# Patient Record
Sex: Female | Born: 1955 | Race: Black or African American | Hispanic: No | Smoking: Never smoker
Health system: Southern US, Community
[De-identification: ages and names within clinical notes are randomized; demographics above are authoritative.]

## PROBLEM LIST (undated history)

## (undated) DIAGNOSIS — I10 Essential (primary) hypertension: Secondary | ICD-10-CM

## (undated) DIAGNOSIS — E119 Type 2 diabetes mellitus without complications: Secondary | ICD-10-CM

---

## 2022-02-26 ENCOUNTER — Inpatient Hospital Stay
Admission: EM | Admit: 2022-02-26 | Discharge: 2022-03-01 | DRG: 637 | Disposition: A | Discharge status: Home / Self Care | Payer: No Typology Code available for payment source | Attending: Student | Admitting: Student

## 2022-02-26 ENCOUNTER — Emergency Department: Payer: No Typology Code available for payment source

## 2022-02-26 ENCOUNTER — Other Ambulatory Visit: Payer: Self-pay

## 2022-02-26 ENCOUNTER — Ambulatory Visit
Admission: EM | Admit: 2022-02-26 | Discharge: 2022-02-26 | Disposition: A | Discharge status: Home / Self Care | Payer: Medicare PPO | Attending: Student | Admitting: Student

## 2022-02-26 DIAGNOSIS — Z79899 Other long term (current) drug therapy: Secondary | ICD-10-CM | POA: Diagnosis not present

## 2022-02-26 DIAGNOSIS — N170 Acute kidney failure with tubular necrosis: Secondary | ICD-10-CM | POA: Diagnosis present

## 2022-02-26 DIAGNOSIS — I9589 Other hypotension: Secondary | ICD-10-CM | POA: Diagnosis not present

## 2022-02-26 DIAGNOSIS — Z880 Allergy status to penicillin: Secondary | ICD-10-CM

## 2022-02-26 DIAGNOSIS — E1110 Type 2 diabetes mellitus with ketoacidosis without coma: Secondary | ICD-10-CM | POA: Diagnosis not present

## 2022-02-26 DIAGNOSIS — Z794 Long term (current) use of insulin: Secondary | ICD-10-CM

## 2022-02-26 DIAGNOSIS — I959 Hypotension, unspecified: Secondary | ICD-10-CM | POA: Diagnosis present

## 2022-02-26 DIAGNOSIS — I10 Essential (primary) hypertension: Secondary | ICD-10-CM | POA: Diagnosis present

## 2022-02-26 DIAGNOSIS — E875 Hyperkalemia: Secondary | ICD-10-CM | POA: Diagnosis not present

## 2022-02-26 DIAGNOSIS — E861 Hypovolemia: Secondary | ICD-10-CM | POA: Diagnosis not present

## 2022-02-26 DIAGNOSIS — E870 Hyperosmolality and hypernatremia: Secondary | ICD-10-CM | POA: Diagnosis present

## 2022-02-26 DIAGNOSIS — N179 Acute kidney failure, unspecified: Secondary | ICD-10-CM | POA: Diagnosis not present

## 2022-02-26 DIAGNOSIS — E785 Hyperlipidemia, unspecified: Secondary | ICD-10-CM | POA: Diagnosis present

## 2022-02-26 HISTORY — DX: Essential (primary) hypertension: I10

## 2022-02-26 HISTORY — DX: Type 2 diabetes mellitus without complications: E11.9

## 2022-02-26 LAB — BASIC METABOLIC PANEL
Anion gap: 24 — ABNORMAL HIGH (ref 5–15)
Anion gap: 14 (ref 5–15)
Anion gap: 12 (ref 5–15)
BUN: 59 mg/dL — ABNORMAL HIGH (ref 8–23)
BUN: 58 mg/dL — ABNORMAL HIGH (ref 8–23)
BUN: 54 mg/dL — ABNORMAL HIGH (ref 8–23)
CO2: 13 mmol/L — ABNORMAL LOW (ref 22–32)
CO2: 19 mmol/L — ABNORMAL LOW (ref 22–32)
CO2: 19 mmol/L — ABNORMAL LOW (ref 22–32)
Calcium: 9.8 mg/dL (ref 8.9–10.3)
Calcium: 9.8 mg/dL (ref 8.9–10.3)
Calcium: 9.8 mg/dL (ref 8.9–10.3)
Chloride: 116 mmol/L — ABNORMAL HIGH (ref 98–111)
Chloride: 126 mmol/L — ABNORMAL HIGH (ref 98–111)
Chloride: 128 mmol/L — ABNORMAL HIGH (ref 98–111)
Creatinine, Ser: 2.1 mg/dL — ABNORMAL HIGH (ref 0.44–1.00)
Creatinine, Ser: 1.76 mg/dL — ABNORMAL HIGH (ref 0.44–1.00)
Creatinine, Ser: 1.52 mg/dL — ABNORMAL HIGH (ref 0.44–1.00)
GFR, Estimated: 25 mL/min — ABNORMAL LOW (ref >60)
GFR, Estimated: 31 mL/min — ABNORMAL LOW (ref >60)
GFR, Estimated: 37 mL/min — ABNORMAL LOW (ref >60)
Glucose, Bld: 649 mg/dL (ref 70–99)
Glucose, Bld: 333 mg/dL — ABNORMAL HIGH (ref 70–99)
Glucose, Bld: 209 mg/dL — ABNORMAL HIGH (ref 70–99)
Potassium: 5.3 mmol/L — ABNORMAL HIGH (ref 3.5–5.1)
Potassium: 4.6 mmol/L (ref 3.5–5.1)
Potassium: 4.9 mmol/L (ref 3.5–5.1)
Sodium: 153 mmol/L — ABNORMAL HIGH (ref 135–145)
Sodium: 159 mmol/L — ABNORMAL HIGH (ref 135–145)
Sodium: 159 mmol/L — ABNORMAL HIGH (ref 135–145)

## 2022-02-26 LAB — CBC WITH DIFFERENTIAL/PLATELET
Abs Immature Granulocytes: 0.09 10*3/uL — ABNORMAL HIGH (ref 0.00–0.07)
Basophils Absolute: 0 10*3/uL (ref 0.0–0.1)
Basophils Relative: 0 %
Eosinophils Absolute: 0 10*3/uL (ref 0.0–0.5)
Eosinophils Relative: 0 %
HCT: 46.4 % — ABNORMAL HIGH (ref 36.0–46.0)
Hemoglobin: 13.6 g/dL (ref 12.0–15.0)
Immature Granulocytes: 1 %
Lymphocytes Relative: 9 %
Lymphs Abs: 0.8 10*3/uL (ref 0.7–4.0)
MCH: 27.9 pg (ref 26.0–34.0)
MCHC: 29.3 g/dL — ABNORMAL LOW (ref 30.0–36.0)
MCV: 95.1 fL (ref 80.0–100.0)
Monocytes Absolute: 0.4 10*3/uL (ref 0.1–1.0)
Monocytes Relative: 5 %
Neutro Abs: 6.7 10*3/uL (ref 1.7–7.7)
Neutrophils Relative %: 85 %
Platelets: 199 10*3/uL (ref 150–400)
RBC: 4.88 MIL/uL (ref 3.87–5.11)
RDW: 13.3 % (ref 11.5–15.5)
WBC: 8 10*3/uL (ref 4.0–10.5)
nRBC: 0 % (ref 0.0–0.2)

## 2022-02-26 LAB — URINALYSIS, COMPLETE (UACMP) WITH MICROSCOPIC
Bacteria, UA: NONE SEEN
Bilirubin Urine: NEGATIVE
Glucose, UA: >=500 mg/dL — AB
Ketones, ur: 80 mg/dL — AB
Leukocytes,Ua: NEGATIVE
Nitrite: NEGATIVE
Protein, ur: 30 mg/dL — AB
Specific Gravity, Urine: 1.02 (ref 1.005–1.030)
pH: 5 (ref 5.0–8.0)

## 2022-02-26 LAB — TROPONIN I (HIGH SENSITIVITY)
Troponin I (High Sensitivity): 10 ng/L (ref <18)
Troponin I (High Sensitivity): 8 ng/L (ref <18)

## 2022-02-26 LAB — OSMOLALITY: Osmolality: 396 mOsm/kg (ref 275–295)

## 2022-02-26 LAB — COMPREHENSIVE METABOLIC PANEL
ALT: 17 U/L (ref 0–44)
AST: 13 U/L — ABNORMAL LOW (ref 15–41)
Albumin: 3.9 g/dL (ref 3.5–5.0)
Alkaline Phosphatase: 134 U/L — ABNORMAL HIGH (ref 38–126)
Anion gap: 25 — ABNORMAL HIGH (ref 5–15)
BUN: 62 mg/dL — ABNORMAL HIGH (ref 8–23)
CO2: 11 mmol/L — ABNORMAL LOW (ref 22–32)
Calcium: 9.1 mg/dL (ref 8.9–10.3)
Chloride: 113 mmol/L — ABNORMAL HIGH (ref 98–111)
Creatinine, Ser: 2.42 mg/dL — ABNORMAL HIGH (ref 0.44–1.00)
GFR, Estimated: 21 mL/min — ABNORMAL LOW (ref >60)
Glucose, Bld: 886 mg/dL (ref 70–99)
Potassium: 7 mmol/L (ref 3.5–5.1)
Sodium: 149 mmol/L — ABNORMAL HIGH (ref 135–145)
Total Bilirubin: 1.3 mg/dL — ABNORMAL HIGH (ref 0.3–1.2)
Total Protein: 7.5 g/dL (ref 6.5–8.1)

## 2022-02-26 LAB — BLOOD GAS, VENOUS
Acid-base deficit: 18.9 mmol/L — ABNORMAL HIGH (ref 0.0–2.0)
Bicarbonate: 11.1 mmol/L — ABNORMAL LOW (ref 20.0–28.0)
O2 Saturation: 46.1 %
Patient temperature: 37
pCO2, Ven: 40 mmHg — ABNORMAL LOW (ref 44–60)
pH, Ven: 7.05 — CL (ref 7.25–7.43)
pO2, Ven: 31 mmHg — CL (ref 32–45)

## 2022-02-26 LAB — GLUCOSE, CAPILLARY
Glucose-Capillary: >600 mg/dL (ref 70–99)
Glucose-Capillary: 437 mg/dL — ABNORMAL HIGH (ref 70–99)
Glucose-Capillary: 375 mg/dL — ABNORMAL HIGH (ref 70–99)
Glucose-Capillary: 262 mg/dL — ABNORMAL HIGH (ref 70–99)
Glucose-Capillary: 149 mg/dL — ABNORMAL HIGH (ref 70–99)
Glucose-Capillary: 156 mg/dL — ABNORMAL HIGH (ref 70–99)
Glucose-Capillary: 152 mg/dL — ABNORMAL HIGH (ref 70–99)

## 2022-02-26 LAB — CBG MONITORING, ED
Glucose-Capillary: >600 mg/dL (ref 70–99)
Glucose-Capillary: >600 mg/dL (ref 70–99)
Glucose-Capillary: >600 mg/dL (ref 70–99)
Glucose-Capillary: 526 mg/dL (ref 70–99)

## 2022-02-26 LAB — HEMOGLOBIN A1C
Hgb A1c MFr Bld: 12.5 % — ABNORMAL HIGH (ref 4.8–5.6)
Mean Plasma Glucose: 312.05 mg/dL

## 2022-02-26 LAB — BETA-HYDROXYBUTYRIC ACID
Beta-Hydroxybutyric Acid: >8 mmol/L — ABNORMAL HIGH (ref 0.05–0.27)
Beta-Hydroxybutyric Acid: 2.9 mmol/L — ABNORMAL HIGH (ref 0.05–0.27)

## 2022-02-26 LAB — LACTIC ACID, PLASMA
Lactic Acid, Venous: 4.7 mmol/L (ref 0.5–1.9)
Lactic Acid, Venous: 3.8 mmol/L (ref 0.5–1.9)

## 2022-02-26 LAB — HIV ANTIBODY (ROUTINE TESTING W REFLEX): HIV Screen 4th Generation wRfx: NONREACTIVE

## 2022-02-26 LAB — MRSA NEXT GEN BY PCR, NASAL: MRSA by PCR Next Gen: NOT DETECTED

## 2022-02-26 LAB — PROCALCITONIN: Procalcitonin: 0.12 ng/mL

## 2022-02-26 MED ORDER — INSULIN REGULAR(HUMAN) IN NACL 100-0.9 UT/100ML-% IV SOLN
INTRAVENOUS | Status: DC
  Filled 2022-02-26: qty 100

## 2022-02-26 MED ORDER — CALCIUM GLUCONATE-NACL 1-0.675 GM/50ML-% IV SOLN
1.0000 g | Freq: Once | INTRAVENOUS | Status: AC
  Administered 2022-02-26: 1000 mg via INTRAVENOUS
  Filled 2022-02-26: qty 50

## 2022-02-26 MED ORDER — ROSUVASTATIN CALCIUM 10 MG PO TABS
10.0000 mg | ORAL_TABLET | Freq: Every day | ORAL | Status: DC
  Administered 2022-02-27 – 2022-03-01 (×3): 10 mg via ORAL
  Filled 2022-02-26 (×3): qty 1

## 2022-02-26 MED ORDER — ENOXAPARIN SODIUM 30 MG/0.3ML IJ SOSY
30.0000 mg | PREFILLED_SYRINGE | INTRAMUSCULAR | Status: DC
Start: 2022-02-26 — End: 2022-02-27
  Administered 2022-02-26: 30 mg via SUBCUTANEOUS
  Filled 2022-02-26: qty 0.3

## 2022-02-26 MED ORDER — INSULIN REGULAR(HUMAN) IN NACL 100-0.9 UT/100ML-% IV SOLN
INTRAVENOUS | Status: DC
  Administered 2022-02-26: 9 [IU]/h via INTRAVENOUS

## 2022-02-26 MED ORDER — VANCOMYCIN HCL IN DEXTROSE 1-5 GM/200ML-% IV SOLN
1000.0000 mg | Freq: Once | INTRAVENOUS | Status: DC
  Filled 2022-02-26: qty 200

## 2022-02-26 MED ORDER — LACTATED RINGERS IV BOLUS
20.0000 mL/kg | Freq: Once | INTRAVENOUS | Status: AC
  Administered 2022-02-26: 1400 mL via INTRAVENOUS

## 2022-02-26 MED ORDER — LACTATED RINGERS IV BOLUS (SEPSIS)
1000.0000 mL | Freq: Once | INTRAVENOUS | Status: AC
  Administered 2022-02-26: 1000 mL via INTRAVENOUS

## 2022-02-26 MED ORDER — SODIUM CHLORIDE 0.9 % IV SOLN
2.0000 g | Freq: Once | INTRAVENOUS | Status: AC
  Administered 2022-02-26: 2 g via INTRAVENOUS
  Filled 2022-02-26: qty 12.5

## 2022-02-26 MED ORDER — LACTATED RINGERS IV SOLN: INTRAVENOUS | Status: DC

## 2022-02-26 MED ORDER — ALBUTEROL SULFATE (2.5 MG/3ML) 0.083% IN NEBU
2.5000 mg | INHALATION_SOLUTION | Freq: Once | RESPIRATORY_TRACT | Status: AC
  Administered 2022-02-26: 2.5 mg via RESPIRATORY_TRACT
  Filled 2022-02-26: qty 3

## 2022-02-26 MED ORDER — DEXTROSE 50 % IV SOLN: 0.0000 mL | INTRAVENOUS | Status: DC | PRN

## 2022-02-26 MED ORDER — LACTATED RINGERS IV BOLUS (SEPSIS)
250.0000 mL | Freq: Once | INTRAVENOUS | Status: AC
Start: 2022-02-26 — End: 2022-02-26
  Administered 2022-02-26: 250 mL via INTRAVENOUS

## 2022-02-26 MED ORDER — VANCOMYCIN HCL 1500 MG/300ML IV SOLN
1500.0000 mg | Freq: Once | INTRAVENOUS | Status: AC
  Administered 2022-02-26: 1500 mg via INTRAVENOUS
  Filled 2022-02-26: qty 300

## 2022-02-26 MED ORDER — LACTATED RINGERS IV BOLUS
1000.0000 mL | INTRAVENOUS | Status: AC
  Administered 2022-02-26: 1000 mL via INTRAVENOUS

## 2022-02-26 MED ORDER — DEXTROSE IN LACTATED RINGERS 5 % IV SOLN: INTRAVENOUS | Status: DC

## 2022-02-26 MED ORDER — CHLORHEXIDINE GLUCONATE CLOTH 2 % EX PADS
6.0000 | MEDICATED_PAD | Freq: Every day | CUTANEOUS | Status: DC
  Administered 2022-02-26 – 2022-02-28 (×2): 6 via TOPICAL

## 2022-02-26 NOTE — Progress Notes (Signed)
PHARMACIST - PHYSICIAN COMMUNICATION  CONCERNING:  Enoxaparin (Lovenox) for DVT Prophylaxis   DESCRIPTION: Patient was prescribed enoxaprin 40mg  q24 hours for VTE prophylaxis.   Filed Weights   02/26/22 1109  Weight: 70 kg (154 lb 5.2 oz)    Body mass index is 24.17 kg/m.  Estimated Creatinine Clearance: 21.9 mL/min (A) (by C-G formula based on SCr of 2.42 mg/dL (H)).  Patient is candidate for enoxaparin 30mg  every 24 hours based on CrCl <18ml/min or Weight <45kg  RECOMMENDATION: Pharmacy has adjusted enoxaparin dose per [facility] policy.  Patient is now receiving enoxaparin 30 mg every 24 hours    31m, PharmD Clinical Pharmacist  02/26/2022 1:45 PM

## 2022-02-26 NOTE — ED Provider Notes (Addendum)
[facility]    CSN: [id number] Arrival date & time: 02/26/22  0909      History   Chief Complaint No chief complaint on file.   HPI Cheryl Rowland is a 66 y.o. female presenting with fatigue and weakness. History diabetes, insulin controlled. Here today with sister who provides history; they do not live together.  Per sister, the patient walked into our clinic and was able to check-in, but since being roomed (~1 hour) she is now altered and weak and unable to say anything but "yes".  She has not complained of any headaches, dizziness, weakness, focal weakness, chest pain, shortness of breath.  No recent falls.  Possibly 1 episode of vomiting last night.  Sister unsure if patient is taking medications as directed, and patient unable to provide history.  HPI  History reviewed. No pertinent past medical history.  There are no problems to display for this patient.   History reviewed. No pertinent surgical history.  OB History   No obstetric history on file.      Home Medications    Prior to Admission medications   Medication Sig Start Date End Date Taking? Authorizing Provider  Cholecalciferol 50 MCG (2000 UT) TABS TAKE ONE TABLET BY MOUTH ONCE DAILY FOR VITAMIN D DEFICIENCY 11/27/21  Yes [provider]  insulin glargine (LANTUS) 100 UNIT/ML injection INJECT 5 UNIT UNDER THE SKIN AT BEDTIME - DO NOT MIX IN SAME SYRINGE WITH OTHER INSULINS 11/01/15  Yes [provider]  lisinopril (ZESTRIL) 40 MG tablet TAKE ONE TABLET BY MOUTH ONCE DAILY FOR HIGH BLOOD PRESSURE ALSO KIDNEY PROTECTION. INCREASED FROM 20MG  TO 40MG  11/27/21  Yes [provider]  rosuvastatin (CRESTOR) 10 MG tablet TAKE ONE TABLET BY MOUTH DAILY FOR PRIMARY PREVENTION OF HEART ATTACK TO LOWER CHOLESTEROL 11/27/21  Yes [provider]    Family History History reviewed. No pertinent family history.  Social History Social History   Tobacco Use   Smoking status:  Never   Smokeless tobacco: Never  Vaping Use   Vaping Use: Never used  Substance Use Topics   Drug use: Never     Allergies   Penicillins   Review of Systems Review of Systems  All other systems reviewed and are negative.   Physical Exam Triage Vital Signs ED Triage Vitals  Enc Vitals Group     BP --      Pulse Rate 02/26/22 0942 78     Resp --      Temp 02/26/22 0942 (!) 97.5 F (36.4 C)     Temp Source 02/26/22 0942 Oral     SpO2 02/26/22 0942 91 %     Weight 02/26/22 0938 166 lb (75.3 kg)     Height 02/26/22 0938 5' 2.5" (1.588 m)     Head Circumference --      Peak Flow --      Pain Score 02/26/22 0938 9     Pain Loc --      Pain Edu? --      Excl. in GC? --    No data found.  Updated Vital Signs Pulse 78   Temp (!) 97.5 F (36.4 C) (Oral)   Ht 5' 2.5" (1.588 m)   Wt 166 lb (75.3 kg)   SpO2 91%   BMI 29.88 kg/m   Visual Acuity Right Eye Distance:   Left Eye Distance:   Bilateral Distance:    Right Eye Near:   Left Eye Near:  Bilateral Near:     Physical Exam Vitals reviewed.  Constitutional:      General: She is not in acute distress.    Appearance: Normal appearance. She is not ill-appearing or diaphoretic.  HENT:     Head: Normocephalic and atraumatic.  Cardiovascular:     Rate and Rhythm: Normal rate and regular rhythm.     Heart sounds: Normal heart sounds.  Pulmonary:     Effort: Pulmonary effort is normal.     Breath sounds: Normal breath sounds.  Skin:    General: Skin is warm.  Neurological:     General: No focal deficit present.     Mental Status: She is alert and oriented to person, place, and time.     Comments: AO x 0. PERRLA, EOMI. Strength 4/5 upper and lower extremities. Unable to perform fingers to thumb or rhomberg. In wheelchair.  Psychiatric:        Mood and Affect: Mood normal.        Behavior: Behavior normal.        Thought Content: Thought content normal.        Judgment: Judgment normal.     UC  Treatments / Results  Labs (all labs ordered are listed, but only abnormal results are displayed) Labs Reviewed  GLUCOSE, CAPILLARY - Abnormal; Notable for the following components:      Result Value   Glucose-Capillary >600 (*)    All other components within normal limits  CBG MONITORING, ED    EKG   Radiology No results found.  Procedures Procedures (including critical care time)  Medications Ordered in UC Medications - No data to display  Initial Impression / Assessment and Plan / UC Course  I have reviewed the triage vital signs and the nursing notes.  Pertinent labs & imaging results that were available during my care of the patient were reviewed by me and considered in my medical decision making (see chart for details).     This patient is a  66 y.o. year old female presenting with weakness and altered mental status x1 day. Unsure of last normal as she lives alone, and pt is unable to provide any history. Sister helps provide history today, but is unsure when the pt last took her insulin, and does not know last normal. Pt is AO x 0, but without focal weakness. POC CBG is >600. Nurse initially unable to collect a BP; per EMS this is now ranging 61-68/30s. Transported to ED via EMS. Level 5 for acute illness that poses a threat to life or bodily function, and (presumed) admission.   Final Clinical Impressions(s) / UC Diagnoses   Final diagnoses:  Diabetic ketoacidosis without coma associated with type 2 diabetes mellitus [facility])     Discharge Instructions      Sent to ED via EMS      ED Prescriptions   None    PDMP not reviewed this encounter.   Hazel Sams, PA-C 02/26/22 1034    Hazel Sams, PA-C 02/26/22 1035

## 2022-02-26 NOTE — Assessment & Plan Note (Addendum)
Recent Labs    02/26/22 1111 02/26/22 1356 02/26/22 1901 02/26/22 2202 02/27/22 0156 02/27/22 0616 02/28/22 0945 03/01/22 0537 03/01/22 1242  BUN 62* 59* 58* 54* 53* 53* 36* 22 25*  CREATININE 2.42* 2.10* 1.76* 1.52* 1.29* 1.31* 0.96 0.70 0.83  Likely hemodynamically mediated from hypotension and DKA.  AKI seems to have resolved.

## 2022-02-26 NOTE — Assessment & Plan Note (Signed)
Secondary to severe metabolic acidosis with concomitant ACE inhibitor use Discontinue lisinopril Patient received a dose of calcium gluconate as well as albuterol in the ER Continue insulin drip Repeat potassium levels

## 2022-02-26 NOTE — ED Provider Notes (Signed)
[facility] Provider Note    Event Date/Time   First MD Initiated Contact with Patient 02/26/22 1105     (approximate)   History   Hyperglycemia (Blood sugar <600, altered , hypotensive per ems, 60's/30's )   HPI  Cheryl Rowland is a 66 y.o. female with history of diabetes and hypertension who presents with generalized weakness and altered mental status.  The patient went to [facility] urgent care with a chief complaint of weakness, but then became altered and hypotensive and EMS was called.  The patient states that she takes Lantus at night and took it last night.  She states that she has been feeling weak.  EMS reports that the patient was found to be hypotensive to the 60s systolic when they arrived and was somnolent but arousable.  They started IV fluids and the patient's blood pressure has come up.    Physical Exam   Triage Vital Signs: ED Triage Vitals [02/26/22 1109]  Enc Vitals Group     BP 115/71     Pulse Rate 99     Resp 17     Temp 98.3 F (36.8 C)     Temp Source Oral     SpO2 98 %     Weight 154 lb 5.2 oz (70 kg)     Height 5\' 7"  (1.702 m)     Head Circumference      Peak Flow      Pain Score 0     Pain Loc      Pain Edu?      Excl. in GC?     Most recent vital signs: Vitals:   02/26/22 1109 02/26/22 1230  BP: 115/71 121/77  Pulse: 99 89  Resp: 17 17  Temp: 98.3 F (36.8 C) 98.1 F (36.7 C)  SpO2: 98% 99%     General: Sleepy appearing but arousable, no distress. CV:  Good peripheral perfusion.  Normal heart sounds. Resp:  Normal effort.  Lungs CTAB. Abd:  Soft and nontender.  No distention.  Other:  Dry mucous membranes.  Motor intact in all extremities.   ED Results / Procedures / Treatments   Labs (all labs ordered are listed, but only abnormal results are displayed) Labs Reviewed  COMPREHENSIVE METABOLIC PANEL - Abnormal; Notable for the following components:      Result Value   Sodium 149 (*)    Potassium  7.0 (*)    Chloride 113 (*)    CO2 11 (*)    Glucose, Bld 886 (*)    BUN 62 (*)    Creatinine, Ser 2.42 (*)    AST 13 (*)    Alkaline Phosphatase 134 (*)    Total Bilirubin 1.3 (*)    GFR, Estimated 21 (*)    Anion gap 25 (*)    All other components within normal limits  BLOOD GAS, VENOUS - Abnormal; Notable for the following components:   pH, Ven 7.05 (*)    pCO2, Ven 40 (*)    pO2, Ven 31 (*)    Bicarbonate 11.1 (*)    Acid-base deficit 18.9 (*)    All other components within normal limits  CBC WITH DIFFERENTIAL/PLATELET - Abnormal; Notable for the following components:   HCT 46.4 (*)    MCHC 29.3 (*)    Abs Immature Granulocytes 0.09 (*)    All other components within normal limits  LACTIC ACID, PLASMA - Abnormal; Notable for the following components:   Lactic Acid,  Venous 4.7 (*)    All other components within normal limits  OSMOLALITY - Abnormal; Notable for the following components:   Osmolality 396 (*)    All other components within normal limits  CBG MONITORING, ED - Abnormal; Notable for the following components:   Glucose-Capillary >600 (*)    All other components within normal limits  CBG MONITORING, ED - Abnormal; Notable for the following components:   Glucose-Capillary >600 (*)    All other components within normal limits  CULTURE, BLOOD (ROUTINE X 2)  CULTURE, BLOOD (ROUTINE X 2)  LACTIC ACID, PLASMA  URINALYSIS, ROUTINE W REFLEX MICROSCOPIC  URINALYSIS, COMPLETE (UACMP) WITH MICROSCOPIC  PROCALCITONIN  TROPONIN I (HIGH SENSITIVITY)  TROPONIN I (HIGH SENSITIVITY)     EKG  ED ECG REPORT I, Dionne BucySebastian Preeti Winegardner, the attending physician, personally viewed and interpreted this ECG.  Date: 02/26/2022 EKG Time: 1112 Rate: 94 Rhythm: normal sinus rhythm QRS Axis: normal Intervals: normal ST/T Wave abnormalities: normal Narrative Interpretation: no evidence of acute ischemia    RADIOLOGY  Chest x-ray: I independently viewed and interpreted the  images; there is no focal consolidation for edema  PROCEDURES:  Critical Care performed: Yes, see critical care procedure note(s)  .Critical Care Performed by: Dionne BucySiadecki, Amauria Younts, MD Authorized by: Dionne BucySiadecki, Chey Rachels, MD   Critical care provider statement:    Critical care time (minutes):  30   Critical care was necessary to treat or prevent imminent or life-threatening deterioration of the following conditions:  Endocrine crisis   Critical care was time spent personally by me on the following activities:  Development of treatment plan with patient or surrogate, discussions with consultants, evaluation of patient's response to treatment, examination of patient, ordering and review of laboratory studies, ordering and review of radiographic studies, ordering and performing treatments and interventions, pulse oximetry, re-evaluation of patient's condition and review of old charts   Care discussed with: admitting provider     MEDICATIONS ORDERED IN ED: Medications  lactated ringers infusion ( Intravenous New Bag/Given 02/26/22 1118)  dextrose 5 % in lactated ringers infusion (has no administration in time range)  dextrose 50 % solution 0-50 mL (has no administration in time range)  insulin regular, human (MYXREDLIN) 100 units/ 100 mL infusion (9 Units/hr Intravenous Rate/Dose Change 02/26/22 1317)  vancomycin (VANCOREADY) IVPB 1500 mg/300 mL (has no administration in time range)  lactated ringers bolus 1,000 mL (0 mLs Intravenous Stopped 02/26/22 1234)  calcium gluconate 1 g/ 50 mL sodium chloride IVPB (1,000 mg Intravenous New Bag/Given 02/26/22 1250)  albuterol (PROVENTIL) (2.5 MG/3ML) 0.083% nebulizer solution 2.5 mg (2.5 mg Nebulization Given 02/26/22 1251)  lactated ringers bolus 1,000 mL (1,000 mLs Intravenous New Bag/Given 02/26/22 1234)    And  lactated ringers bolus 250 mL (250 mLs Intravenous New Bag/Given 02/26/22 1253)  ceFEPIme (MAXIPIME) 2 g in sodium chloride 0.9 % 100 mL IVPB (0  g Intravenous Stopped 02/26/22 1309)     IMPRESSION / MDM / ASSESSMENT AND PLAN / ED COURSE  I reviewed the triage vital signs and the nursing notes.  66 year old female with a history of diabetes and hypertension presents with altered mental status and hypotension after presenting to urgent care with complaint of generalized weakness and fatigue.  I reviewed the past medical records.  The patient has no prior ED visits or admissions here.  I reviewed the note from [place] urgent care from this morning.  The patient presented alert and was able to check-in but then became altered and weak.  On exam currently the patient is somnolent but arousable to voice.  She is oriented x4 and follows commands.  Neurologic exam is nonfocal.  Her vital signs are currently normal although she was significantly hypotensive with EMS.  Differential diagnosis includes, but is not limited to, DKA, HHS, hyperglycemia with dehydration/hypovolemia, other metabolic disturbance, acute infection/sepsis, or less likely AMS related to CNS or cardiac etiology.  Patient's presentation is most consistent with acute presentation with potential threat to life or bodily function.  We will give IV fluids, put the patient on an insulin infusion, obtain lab work-up, and reassess.  The patient is on the cardiac monitor to evaluate for evidence of arrhythmia and/or significant heart rate changes.  ----------------------------------------- 1:34 PM on 02/26/2022 -----------------------------------------  Lab work-up is concerning for DKA.  The patient has a pH of 7.05 and anion gap of 25.  Glucose is over 800.  Osmolality is elevated.  The lactate is elevated.  I suspect that this is due to the DKA but have ordered empiric antibiotics and fluids for possible sepsis.  The patient is on the insulin infusion.  Her vital signs have remained stable.  I consulted Dr. Karna Christmas from the ICU and discussed the patient with him.  He  recommended admission to the hospitalist service on stepdown.  I then consulted Dr. Joylene Igo from the hospitalist service; based on our discussion she agreed to admit the patient.   FINAL CLINICAL IMPRESSION(S) / ED DIAGNOSES   Final diagnoses:  Diabetic ketoacidosis without coma associated with type 2 diabetes mellitus (HCC)     Rx / DC Orders   ED Discharge Orders     None        Note:  This document was prepared using Dragon voice recognition software and may include unintentional dictation errors.    Dionne Bucy, MD 02/26/22 [id number]

## 2022-02-26 NOTE — Discharge Instructions (Addendum)
Sent to ED via EMS  ?

## 2022-02-26 NOTE — ED Triage Notes (Signed)
Patient c.o having no energy -- been going on for about 3 days now.

## 2022-02-26 NOTE — Progress Notes (Signed)
Inpatient Diabetes Program Recommendations  AACE/ADA: New Consensus Statement on Inpatient Glycemic Control (2015)  Target Ranges:  Prepandial:   less than 140 mg/dL      Peak postprandial:   less than 180 mg/dL (1-2 hours)      Critically ill patients:  140 - 180 mg/dL    Latest Reference Range & Units 02/26/22 11:11  Sodium 135 - 145 mmol/L 149 (H)  Potassium 3.5 - 5.1 mmol/L 7.0 (HH)  Chloride 98 - 111 mmol/L 113 (H)  CO2 22 - 32 mmol/L 11 (L)  Glucose 70 - 99 mg/dL 628 (HH)  BUN 8 - 23 mg/dL 62 (H)  Creatinine 3.66 - 1.00 mg/dL 2.94 (H)  Calcium 8.9 - 10.3 mg/dL 9.1  Anion gap 5 - 15  25 (H)    Latest Reference Range & Units 02/26/22 10:23 02/26/22 11:12 02/26/22 13:11 02/26/22 14:12  Glucose-Capillary 70 - 99 mg/dL >765 (HH) >465 (HH)  IV Insulin Drip Started at 1215 >600 (HH) >600 (HH)     Admit with: DKA  History: DM  Home DM Meds: Lantus 5 units QHS  Current Orders: IV Insulin Drip    Note IV Insulin Drip started around 12pm today  Current A1c level pending--[facility]1c [facility] was 7% (11/27/2021)  [facility]19/[facility]22  Will follow   --Will follow patient during hospitalization--  Ambrose Finland RN, MSN, CDE Diabetes Coordinator Inpatient Glycemic Control Team Team Pager: [phone] (8a-5p)

## 2022-02-26 NOTE — ED Notes (Signed)
Ems on site

## 2022-02-26 NOTE — ED Notes (Signed)
Patient is being discharged from the Urgent Care and sent to the Emergency Department via EMS . Per Marin Roberts, PA, patient is in need of higher level of care due to hyperglycemia, hypotensive, and altered loc. Patient is aware and verbalizes understanding of plan of care.  Vitals:   02/26/22 0942  Pulse: 78  Temp: (!) 97.5 F (36.4 C)  SpO2: 91%

## 2022-02-26 NOTE — Assessment & Plan Note (Addendum)
Likely due to DKA and lisinopril.  Resolved.

## 2022-02-26 NOTE — ED Notes (Signed)
Patient is being discharged from the Urgent Care and sent to the Emergency Department via EMS . Per Ignacia Bayley, patient is in need of higher level of care due to Altered mental status.. Patient is aware and verbalizes understanding of plan of care.  Vitals:   02/26/22 0942  Pulse: 78  Temp: (!) 97.5 F (36.4 C)  SpO2: 91%

## 2022-02-26 NOTE — ED Triage Notes (Signed)
Blood sugar <600, altered , hypotensive per ems, 60's/30's

## 2022-02-26 NOTE — Assessment & Plan Note (Signed)
Patient presents for evaluation of weakness and noted to have severe hyperglycemia with an anion gap metabolic acidosis (AG 25) Continue insulin drip initiated in the ER Continue aggressive IV fluid resuscitation Check serial BMP and supplement electrolytes as needed Check serial beta hydroxybutyric acid levels Concern for possible infectious etiology as the trigger for this episode of DKA Follow-up results of BUN Keep patient n.p.o. for now

## 2022-02-26 NOTE — ED Notes (Signed)
John RN aware of assigned bed 

## 2022-02-26 NOTE — Consult Note (Signed)
PHARMACY -  BRIEF ANTIBIOTIC NOTE   Pharmacy has received consult(s) for vancomycin and cefepime from an ED provider.  The patient's profile has been reviewed for ht/wt/allergies/indication/available labs.    One time order(s) placed for cefepime 2 g and vancomycin 1500 mg IV   Further antibiotics/pharmacy consults should be ordered by admitting physician if indicated.                       Thank you, Derrek Gu, PharmD 02/26/2022  12:51 PM

## 2022-02-26 NOTE — ED Notes (Signed)
Mickel Baas , pa at bedside for cbg and gave report to paramedic on site

## 2022-02-26 NOTE — ED Notes (Signed)
Sister reports she dropped patient off out front of building and patient walked into building.  Time in department 1 hour, 20 minutes.  Patient was able to tell patient access her name and weakness.  Per EMS, patient was able to give her medical cards to patient access.    Notified EMS of timeline and patient abilities on arrival to department

## 2022-02-26 NOTE — H&P (Signed)
History and Physical    Patient: Cheryl Rowland DOB: Apr 10, 1956 DOA: 02/26/2022 DOS: the patient was seen and examined on 02/26/2022 PCP: Pcp, No  Patient coming from: Home  Chief Complaint:  Chief Complaint  Patient presents with   Hyperglycemia    Blood sugar <600, altered , hypotensive per ems, 60's/30's    HPI: Cheryl RichtersHelen Sawhney is a 66 y.o. female with medical history significant for insulin-dependent diabetes mellitus, hypertension, dyslipidemia who was brought into the ER from the urgent care center where she went for evaluation of weakness which she has had for 3 days. Patient states that she has been weak for about 3 days, denies having any falls and has not missed any of her insulin doses.  She states that she has had chills, frequency of urination and dysuria. She had walked into the urgent care center for evaluation on the day of admission or while being roomed she was noted to have become lethargic.  Point-of-care blood sugar was greater than 600 and patient was hypotensive with blood pressure 61 - 68/30. Patient received 500 cc of IV fluid bolus with improvement in her blood pressure. She states that she has had midsternal chest pain, nonradiating, intermittent, not related to rest or exertion.  She also complains of abdominal pain mostly in the epigastrium associated with nausea and vomiting but denies having any diarrhea. She denies having any fever, no leg swelling, no blurred vision or focal deficit. Labs reveal an anion gap metabolic acidosis with significant hyperglycemia.  Patient received 2,250 L of LR in the ER, a dose of calcium gluconate and albuterol for hyperkalemia as well as a dose of vancomycin and cefepime for suspected sepsis.  Review of Systems: As mentioned in the history of present illness. All other systems reviewed and are negative. Past Medical History:  Diagnosis Date   Diabetes mellitus without complication (HCC)    Hypertension     History reviewed. No pertinent surgical history. Social History:  reports that she has never smoked. She has never used smokeless tobacco. She reports that she does not use drugs. No history on file for alcohol use.  Allergies  Allergen Reactions   Penicillins     History reviewed. No pertinent family history.  Prior to Admission medications   Medication Sig Start Date End Date Taking? Authorizing Provider  Cholecalciferol 50 MCG (2000 UT) TABS TAKE ONE TABLET BY MOUTH ONCE DAILY FOR VITAMIN D DEFICIENCY 11/27/21   [provider]  insulin glargine (LANTUS) 100 UNIT/ML injection INJECT 5 UNIT UNDER THE SKIN AT BEDTIME - DO NOT MIX IN SAME SYRINGE WITH OTHER INSULINS 11/01/15   [provider]  lisinopril (ZESTRIL) 40 MG tablet TAKE ONE TABLET BY MOUTH ONCE DAILY FOR HIGH BLOOD PRESSURE ALSO KIDNEY PROTECTION. INCREASED FROM 20MG  TO 40MG  11/27/21   [provider]  rosuvastatin (CRESTOR) 10 MG tablet TAKE ONE TABLET BY MOUTH DAILY FOR PRIMARY PREVENTION OF HEART ATTACK TO LOWER CHOLESTEROL 11/27/21   [provider]    Physical Exam: Vitals:   02/26/22 1109 02/26/22 1230  BP: 115/71 121/77  Pulse: 99 89  Resp: 17 17  Temp: 98.3 F (36.8 C) 98.1 F (36.7 C)  TempSrc: Oral Oral  SpO2: 98% 99%  Weight: 70 kg   Height: 5\' 7"  (1.702 m)    Physical Exam Vitals and nursing note reviewed.  Constitutional:      Comments: Lethargic but arouses easily  HENT:     Head: Normocephalic and atraumatic.  Nose: Nose normal.     Mouth/Throat:     Mouth: Mucous membranes are dry.  Eyes:     Pupils: Pupils are equal, round, and reactive to light.  Cardiovascular:     Rate and Rhythm: Normal rate and regular rhythm.  Pulmonary:     Effort: Pulmonary effort is normal.     Breath sounds: Normal breath sounds.  Abdominal:     General: Abdomen is flat. Bowel sounds are normal.     Palpations: Abdomen is soft.     Tenderness: There is abdominal  tenderness.     Comments: Epigastric tenderness  Musculoskeletal:        General: Normal range of motion.     Cervical back: Normal range of motion and neck supple.  Skin:    General: Skin is warm and dry.  Neurological:     Motor: Weakness present.  Psychiatric:        Mood and Affect: Mood normal.        Behavior: Behavior normal.    Data Reviewed: Relevant notes from primary care and specialist visits, past discharge summaries as available in EHR, including Care Everywhere. Prior diagnostic testing as pertinent to current admission diagnoses Updated medications and problem lists for reconciliation ED course, including vitals, labs, imaging, treatment and response to treatment Triage notes, nursing and pharmacy notes and ED provider's notes Notable results as noted in HPI Labs reviewed.  White count 8.0, hemoglobin 13.6, hematocrit 46.4, RDW 13.3, platelet count 199, lactic acid 4.7, troponin 10, sodium 149, potassium 7.0, chloride 113, bicarb 11, glucose 886, BUN 62, creatinine 2.43 compared to a baseline of 1.01, calcium 9.1, total protein 7.5, albumin 3.9, AST 13, ALT 17, alkaline phosphatase 134, total bilirubin 1.3, anion gap 25, serum osmolality 396 VBG 7.05/40/31/11.1/46.1 Chest x-ray reviewed by me shows no evidence of acute cardiopulmonary disease Twelve-lead EKG reviewed by me shows sinus rhythm  There are no new results to review at this time.  Assessment and Plan: * DKA (diabetic ketoacidosis) (HCC) Patient presents for evaluation of weakness and noted to have severe hyperglycemia with an anion gap metabolic acidosis (AG 25) Continue insulin drip initiated in the ER Continue aggressive IV fluid resuscitation Check serial BMP and supplement electrolytes as needed Check serial beta hydroxybutyric acid levels Concern for possible infectious etiology as the trigger for this episode of DKA Follow-up results of BUN Keep patient n.p.o. for now  AKI (acute kidney injury)  (HCC) Most likely secondary to osmotic diuresis and ATN from hypotension At baseline patient has a serum creatinine of 1.01 and today on admission her serum creatinine was 2.42 Continue aggressive IV fluid resuscitation Hold lisinopril Repeat renal parameters in a.m. Consult nephrology if no improvement in renal function  Hyperkalemia Secondary to severe metabolic acidosis with concomitant ACE inhibitor use Discontinue lisinopril Patient received a dose of calcium gluconate as well as albuterol in the ER Continue insulin drip Repeat potassium levels  Hypotension Patient presents to the ER via EMS for evaluation of weakness, lethargy and hypotension. Unclear etiology for hypotension but most likely is secondary to osmotic diuresis from hyperglycemia versus from an infectious process/sepsis Blood pressure improved with aggressive IV fluid resuscitation Hold lisinopril Obtain procalcitonin levels      Advance Care Planning:   Code Status: Full Code   Consults: None  Family Communication: Greater than 50% of time was spent discussing patient's condition and plan of care with her at the bedside.  All questions and concerns have been addressed.  She verbalizes understanding and agrees with the plan  Severity of Illness: The appropriate patient status for this patient is INPATIENT. Inpatient status is judged to be reasonable and necessary in order to provide the required intensity of service to ensure the patient's safety. The patient's presenting symptoms, physical exam findings, and initial radiographic and laboratory data in the context of their chronic comorbidities is felt to place them at high risk for further clinical deterioration. Furthermore, it is not anticipated that the patient will be medically stable for discharge from the hospital within 2 midnights of admission.   * I certify that at the point of admission it is my clinical judgment that the patient will require inpatient  hospital care spanning beyond 2 midnights from the point of admission due to high intensity of service, high risk for further deterioration and high frequency of surveillance required.*  Author: Lucile Shutters, MD 02/26/2022 1:45 PM  For on call review [email or web address].

## 2022-02-26 NOTE — Progress Notes (Signed)
Pt alert and oriented following commands in no acute distress; denies shortness of breath, vss; and sbp 120's.  She is appropriate for stepdown status  Cheryl Rowland, AGNP  Pulmonary/Critical Care Pager [phone] (please enter 7 digits) PCCM Consult Pager [phone] (please enter 7 digits)

## 2022-02-27 DIAGNOSIS — E875 Hyperkalemia: Secondary | ICD-10-CM | POA: Diagnosis not present

## 2022-02-27 DIAGNOSIS — I9589 Other hypotension: Secondary | ICD-10-CM | POA: Diagnosis not present

## 2022-02-27 DIAGNOSIS — E1110 Type 2 diabetes mellitus with ketoacidosis without coma: Secondary | ICD-10-CM | POA: Diagnosis not present

## 2022-02-27 DIAGNOSIS — N179 Acute kidney failure, unspecified: Secondary | ICD-10-CM | POA: Diagnosis not present

## 2022-02-27 DIAGNOSIS — E870 Hyperosmolality and hypernatremia: Secondary | ICD-10-CM

## 2022-02-27 LAB — GLUCOSE, CAPILLARY
Glucose-Capillary: 158 mg/dL — ABNORMAL HIGH (ref 70–99)
Glucose-Capillary: 166 mg/dL — ABNORMAL HIGH (ref 70–99)
Glucose-Capillary: 184 mg/dL — ABNORMAL HIGH (ref 70–99)
Glucose-Capillary: 191 mg/dL — ABNORMAL HIGH (ref 70–99)
Glucose-Capillary: 199 mg/dL — ABNORMAL HIGH (ref 70–99)
Glucose-Capillary: 203 mg/dL — ABNORMAL HIGH (ref 70–99)
Glucose-Capillary: 270 mg/dL — ABNORMAL HIGH (ref 70–99)
Glucose-Capillary: 272 mg/dL — ABNORMAL HIGH (ref 70–99)
Glucose-Capillary: 287 mg/dL — ABNORMAL HIGH (ref 70–99)

## 2022-02-27 LAB — BASIC METABOLIC PANEL
Anion gap: 9 (ref 5–15)
Anion gap: 10 (ref 5–15)
BUN: 53 mg/dL — ABNORMAL HIGH (ref 8–23)
BUN: 53 mg/dL — ABNORMAL HIGH (ref 8–23)
CO2: 22 mmol/L (ref 22–32)
CO2: 21 mmol/L — ABNORMAL LOW (ref 22–32)
Calcium: 9.4 mg/dL (ref 8.9–10.3)
Calcium: 9.3 mg/dL (ref 8.9–10.3)
Chloride: 128 mmol/L — ABNORMAL HIGH (ref 98–111)
Chloride: 126 mmol/L — ABNORMAL HIGH (ref 98–111)
Creatinine, Ser: 1.29 mg/dL — ABNORMAL HIGH (ref 0.44–1.00)
Creatinine, Ser: 1.31 mg/dL — ABNORMAL HIGH (ref 0.44–1.00)
GFR, Estimated: 45 mL/min — ABNORMAL LOW (ref >60)
GFR, Estimated: 45 mL/min — ABNORMAL LOW (ref >60)
Glucose, Bld: 250 mg/dL — ABNORMAL HIGH (ref 70–99)
Glucose, Bld: 239 mg/dL — ABNORMAL HIGH (ref 70–99)
Potassium: 4.7 mmol/L (ref 3.5–5.1)
Potassium: 4.7 mmol/L (ref 3.5–5.1)
Sodium: 159 mmol/L — ABNORMAL HIGH (ref 135–145)
Sodium: 157 mmol/L — ABNORMAL HIGH (ref 135–145)

## 2022-02-27 LAB — BETA-HYDROXYBUTYRIC ACID: Beta-Hydroxybutyric Acid: 2.59 mmol/L — ABNORMAL HIGH (ref 0.05–0.27)

## 2022-02-27 LAB — PROCALCITONIN: Procalcitonin: 0.1 ng/mL

## 2022-02-27 LAB — LACTIC ACID, PLASMA: Lactic Acid, Venous: 2.7 mmol/L (ref 0.5–1.9)

## 2022-02-27 MED ORDER — ENOXAPARIN SODIUM 40 MG/0.4ML IJ SOSY
40.0000 mg | PREFILLED_SYRINGE | INTRAMUSCULAR | Status: DC
  Administered 2022-02-27 – 2022-02-28 (×2): 40 mg via SUBCUTANEOUS
  Filled 2022-02-27 (×2): qty 0.4

## 2022-02-27 MED ORDER — LIVING WELL WITH DIABETES BOOK
Freq: Once | Status: AC
  Filled 2022-02-27: qty 1

## 2022-02-27 MED ORDER — LACTATED RINGERS IV SOLN
INTRAVENOUS | Status: DC
Start: 2022-02-27 — End: 2022-02-28

## 2022-02-27 MED ORDER — INSULIN ASPART 100 UNIT/ML IJ SOLN
4.0000 [IU] | Freq: Three times a day (TID) | INTRAMUSCULAR | Status: DC
  Administered 2022-02-27 – 2022-02-28 (×5): 4 [IU] via SUBCUTANEOUS
  Filled 2022-02-27 (×5): qty 1

## 2022-02-27 MED ORDER — INSULIN GLARGINE-YFGN 100 UNIT/ML ~~LOC~~ SOLN
5.0000 [IU] | Freq: Every day | SUBCUTANEOUS | Status: DC
Start: 2022-02-27 — End: 2022-02-27
  Administered 2022-02-27: 5 [IU] via SUBCUTANEOUS
  Filled 2022-02-27: qty 0.05

## 2022-02-27 MED ORDER — INSULIN ASPART 100 UNIT/ML IJ SOLN: 0.0000 [IU] | Freq: Every day | INTRAMUSCULAR | Status: DC

## 2022-02-27 MED ORDER — ONDANSETRON HCL 4 MG/2ML IJ SOLN
4.0000 mg | Freq: Once | INTRAMUSCULAR | Status: AC
  Administered 2022-02-27: 4 mg via INTRAVENOUS
  Filled 2022-02-27: qty 2

## 2022-02-27 MED ORDER — INSULIN GLARGINE-YFGN 100 UNIT/ML ~~LOC~~ SOLN
5.0000 [IU] | Freq: Two times a day (BID) | SUBCUTANEOUS | Status: DC
  Administered 2022-02-27 – 2022-02-28 (×2): 5 [IU] via SUBCUTANEOUS
  Filled 2022-02-27 (×3): qty 0.05

## 2022-02-27 MED ORDER — INSULIN ASPART 100 UNIT/ML IJ SOLN
0.0000 [IU] | Freq: Three times a day (TID) | INTRAMUSCULAR | Status: DC
  Administered 2022-02-27 (×3): 8 [IU] via SUBCUTANEOUS
  Administered 2022-02-28: 0 [IU] via SUBCUTANEOUS
  Administered 2022-02-28 (×2): 11 [IU] via SUBCUTANEOUS
  Administered 2022-03-01: 5 [IU] via SUBCUTANEOUS
  Administered 2022-03-01: 8 [IU] via SUBCUTANEOUS
  Filled 2022-02-27 (×7): qty 1

## 2022-02-27 NOTE — Progress Notes (Signed)
Inpatient Diabetes Program Recommendations  AACE/ADA: New Consensus Statement on Inpatient Glycemic Control (2015)  Target Ranges:  Prepandial:   less than 140 mg/dL      Peak postprandial:   less than 180 mg/dL (1-2 hours)      Critically ill patients:  140 - 180 mg/dL   Lab Results  Component Value Date   GLUCAP 287 (H) 02/27/2022   HGBA1C 12.5 (H) 02/26/2022    Latest Reference Range & Units 02/27/22 06:16  Potassium 3.5 - 5.1 mmol/L 4.7  Chloride 98 - 111 mmol/L 126 (H)  CO2 22 - 32 mmol/L 21 (L)  Glucose 70 - 99 mg/dL 932 (H)  BUN 8 - 23 mg/dL 53 (H)  Creatinine 3.55 - 1.00 mg/dL 7.32 (H)  Calcium 8.9 - 10.3 mg/dL 9.3  Anion gap 5 - 15  10  GFR, Estimated >60 mL/min 45 (L)  Beta-Hydroxybutyric Acid 0.05 - 0.27 mmol/L 2.59 (H)  (H): Data is abnormally high (L): Data is abnormally low  Review of Glycemic Control  Admit with: DKA   History: DM   Home DM Meds: Lantus 5 units QHS Current orders for Inpatient glycemic control: Semglee 5 units qd, Novolog 4 units tid meal coverage, Novolog correction 0-15 units tid, 0-5 units hs  Inpatient Diabetes Program Recommendations:   -Increase Semglee to 5 units bid or 10 units qd  Spoke with pt about A1C results 12.5 (average blood glucose ) and explained what an A1C is, basic pathophysiology of DM Type 2, basic home care, basic diabetes diet nutrition principles, importance of checking CBGs and maintaining good CBG control to prevent long-term and short-term complications. Reviewed signs and symptoms of hyperglycemia and hypoglycemia and how to treat hypoglycemia at home. Also reviewed blood sugar goals at home.  RNs to provide ongoing basic DM education at bedside with this patient. Have ordered educational booklet.  Patient states she doesn't know of anything different that she is eating or doing and no illness that she can account for A1c increaseing from 7.0 on 11/27/21 to currently 12.5. Patient states she has been taking an oral  diabetes medication but uncertain what the medication was and is not listed on office visits from the [place]. Patient surprised in hearing the increase in the A1c. Patient currently has been checking CBGs 2 times daily and requested patient to check more often of qid to determine when CBGs are elevated.  Thank you, Billy Fischer. Hillery Zachman, RN, MSN, CDE  Diabetes Coordinator Inpatient Glycemic Control Team Team Pager [phone] (8am-5pm) 02/27/2022 12:34 PM

## 2022-02-27 NOTE — Progress Notes (Signed)
PHARMACIST - PHYSICIAN COMMUNICATION  CONCERNING:  Enoxaparin (Lovenox) for DVT Prophylaxis    RECOMMENDATION: Patient was prescribed enoxaprin 30mg  q24 hours for VTE prophylaxis.   Filed Weights   02/26/22 1109 02/26/22 1630  Weight: 70 kg (154 lb 5.2 oz) 70.3 kg (154 lb 15.7 oz)    Body mass index is 27.9 kg/m.  Estimated Creatinine Clearance: 38.7 mL/min (A) (by C-G formula based on SCr of 1.31 mg/dL (H)).   Patient is candidate for enoxaparin 40mg  every 24 hours based on CrCl >14ml/min   DESCRIPTION: Pharmacy has adjusted enoxaparin dose per [facility] policy.  Patient is now receiving enoxaparin 40 mg every 24 hours    31m, PharmD Clinical Pharmacist  02/27/2022 7:22 AM

## 2022-02-27 NOTE — Progress Notes (Addendum)
PROGRESS NOTE    Cheryl RichtersHelen Rowland  ZOX:[id number] DOB: Mar 17, 1956 DOA: 02/26/2022 PCP: Pcp, No    Brief Narrative:  Cheryl RichtersHelen Rowland is a 66 y.o. female with medical history significant for insulin-dependent diabetes mellitus, hypertension, dyslipidemia who was brought into the ER from the urgent care center where she went for evaluation of weakness which she has had for 3 days. Patient states that she has been weak for about 3 days, denies having any falls and has not missed any of her insulin doses.  She states that she has had chills, frequency of urination and dysuria. She had walked into the urgent care center for evaluation on the day of admission or while being roomed she was noted to have become lethargic.  Point-of-care blood sugar was greater than 600 and patient was hypotensive with blood pressure 61 - 68/30. Patient received 500 cc of IV fluid bolus with improvement in her blood pressure. She states that she has had midsternal chest pain, nonradiating, intermittent, not related to rest or exertion.  She also complains of abdominal pain mostly in the epigastrium associated with nausea and vomiting but denies having any diarrhea. She denies having any fever, no leg swelling, no blurred vision or focal deficit. Labs reveal an anion gap metabolic acidosis with significant hyperglycemia.  Patient received 2,250 L of LR in the ER, a dose of calcium gluconate and albuterol for hyperkalemia as well as a dose of vancomycin and cefepime for suspected sepsis    Consultants:    Procedures:   Antimicrobials:     Subjective: Feels nauseous this AM.  No vomiting or abdominal pain  Objective: Vitals:   02/27/22 1000 02/27/22 1100 02/27/22 1200 02/27/22 1300  BP: (!) 159/132 (!) 142/92 (!) 158/86 (!) 146/102  Pulse: (!) 104     Resp:  17 14 16   Temp:   98 F (36.7 C)   TempSrc:   Oral   SpO2: 100%  100%   Weight:      Height:        Intake/Output Summary (Last 24 hours) at  02/27/2022 1605 Last data filed at 02/27/2022 0600 Gross per 24 hour  Intake 1537.59 ml  Output 325 ml  Net 1212.59 ml   Filed Weights   02/26/22 1109 02/26/22 1630  Weight: 70 kg 70.3 kg    Examination: Calm, NAD Cta no w/r Reg s1/s2 no gallop Soft benign +bs No edema Aaoxox3  Mood and affect appropriate in current setting     Data Reviewed: I have personally reviewed following labs and imaging studies  CBC: Recent Labs  Lab 02/26/22 1111  WBC 8.0  NEUTROABS 6.7  HGB 13.6  HCT 46.4*  MCV 95.1  PLT 199   Basic Metabolic Panel: Recent Labs  Lab 02/26/22 1356 02/26/22 1901 02/26/22 2202 02/27/22 0156 02/27/22 0616  NA 153* 159* 159* 159* 157*  K 5.3* 4.6 4.9 4.7 4.7  CL 116* 126* 128* 128* 126*  CO2 13* 19* 19* 22 21*  GLUCOSE 649* 333* 209* 250* 239*  BUN 59* 58* 54* 53* 53*  CREATININE 2.10* 1.76* 1.52* 1.29* 1.31*  CALCIUM 9.8 9.8 9.8 9.4 9.3   GFR: Estimated Creatinine Clearance: 38.7 mL/min (A) (by C-G formula based on SCr of 1.31 mg/dL (H)). Liver Function Tests: Recent Labs  Lab 02/26/22 1111  AST 13*  ALT 17  ALKPHOS 134*  BILITOT 1.3*  PROT 7.5  ALBUMIN 3.9   No results for input(s): LIPASE, AMYLASE in the last 168 hours. No  results for input(s): AMMONIA in the last 168 hours. Coagulation Profile: No results for input(s): INR, PROTIME in the last 168 hours. Cardiac Enzymes: No results for input(s): CKTOTAL, CKMB, CKMBINDEX, TROPONINI in the last 168 hours. BNP (last 3 results) No results for input(s): PROBNP in the last 8760 hours. HbA1C: Recent Labs    02/26/22 1356  HGBA1C 12.5*   CBG: Recent Labs  Lab 02/27/22 0309 02/27/22 0401 02/27/22 0724 02/27/22 1135 02/27/22 1541  GLUCAP 184* 158* 272* 287* 270*   Lipid Profile: No results for input(s): CHOL, HDL, LDLCALC, TRIG, CHOLHDL, LDLDIRECT in the last 72 hours. Thyroid Function Tests: No results for input(s): TSH, T4TOTAL, FREET4, T3FREE, THYROIDAB in the last 72  hours. Anemia Panel: No results for input(s): VITAMINB12, FOLATE, FERRITIN, TIBC, IRON, RETICCTPCT in the last 72 hours. Sepsis Labs: Recent Labs  Lab 02/26/22 1111 02/26/22 1356 02/27/22 0156  PROCALCITON  --  0.12 0.10  LATICACIDVEN 4.7* 3.8*  --     Recent Results (from the past 240 hour(s))  Culture, blood (routine x 2)     Status: None (Preliminary result)   Collection Time: 02/26/22 12:42 PM   Specimen: BLOOD  Result Value Ref Range Status   Specimen Description BLOOD RIGHT [facility]  Final   Special Requests   Final    BOTTLES DRAWN AEROBIC AND ANAEROBIC Blood Culture adequate volume   Culture   Final    NO GROWTH < 24 HOURS Performed at [facility], [address] [place], [place] [address]    Report Status PENDING  Incomplete  Culture, blood (routine x 2)     Status: None (Preliminary result)   Collection Time: 02/26/22 12:42 PM   Specimen: BLOOD  Result Value Ref Range Status   Specimen Description BLOOD LEFT AC  Final   Special Requests   Final    BOTTLES DRAWN AEROBIC AND ANAEROBIC Blood Culture adequate volume   Culture   Final    NO GROWTH < 24 HOURS Performed at [facility], [address] [place], [place] [address]    Report Status PENDING  Incomplete  MRSA Next Gen by PCR, Nasal     Status: None   Collection Time: 02/26/22  4:35 PM   Specimen: Nasal Mucosa; Nasal Swab  Result Value Ref Range Status   MRSA by PCR Next Gen NOT DETECTED NOT DETECTED Final    Comment: (NOTE) The GeneXpert MRSA Assay (FDA approved for NASAL specimens only), is one component of a comprehensive MRSA colonization surveillance program. It is not intended to diagnose MRSA infection nor to guide or monitor treatment for MRSA infections. Test performance is not FDA approved in patients less than 76 years old. Performed at [facility], [address], [place], [place] [address]          Radiology Studies: [facility] Chest [place] 1 View  Result Date:  02/26/2022 CLINICAL DATA:  Hyperglycemia.  Sepsis.  Altered mental status. EXAM: PORTABLE CHEST 1 VIEW COMPARISON:  None Available. FINDINGS: Cardiac silhouette is normal in size and configuration. Normal mediastinal and hilar contours. Clear lungs.  No pleural effusion or pneumothorax. Skeletal structures are grossly intact. IMPRESSION: No active disease. Electronically Signed   By: Amie Portland M.D.   On: 02/26/2022 12:54        Scheduled Meds:  Chlorhexidine Gluconate Cloth  6 each Topical Q0600   enoxaparin (LOVENOX) injection  40 mg Subcutaneous Q24H   insulin aspart  0-15 Units Subcutaneous TID WC  insulin aspart  0-5 Units Subcutaneous QHS   insulin aspart  4 Units Subcutaneous TID WC   insulin glargine-yfgn  5 Units Subcutaneous BID   living well with diabetes book   Does not apply Once   rosuvastatin  10 mg Oral Daily   Continuous Infusions:  lactated ringers 125 mL/hr at 02/27/22 1124    Assessment & Plan:   Principal Problem:   DKA (diabetic ketoacidosis) (HCC) Active Problems:   AKI (acute kidney injury) (HCC)   Hyperkalemia   Hypotension   Hypernatremia   DKA (diabetic ketoacidosis) (HCC) Patient presents for evaluation of weakness and noted to have severe hyperglycemia with an anion gap metabolic acidosis (AG 25) 5/30 was placed on DKA protocol Gap closed Concern for possible infectious etiology as a trigger for this episode of DKA, UA negative, blood cultures pending, chest x-ray without any acute findings A1c 12.5 Received aggressive IV hydration Diabetic educator following We will increase Semglee to 5 units twice daily Continue NovoLog with meals     AKI (acute kidney injury) (HCC) Most likely secondary to osmotic diuresis and ATN from hypotension At baseline patient has a serum creatinine of 1.01  5/30 improving with IV fluids Continue Avoid nephrotoxic meds    Hyperkalemia Secondary to severe metabolic acidosis with concomitant ACE inhibitor  use 5/30 K 7.0 >>>>4.7 Lisinopril discontinued Received calcium gluconate in the ER along with albuterol Received continuous insulin drip per protocol above Repeat K normal Told patient to avoid food with high potassium content as she has a banana next to her.     Hypotension Patient presents to the ER via EMS for evaluation of weakness, lethargy and hypotension. Unclear etiology for hypotension but most likely is secondary to osmotic diuresis from hyperglycemia versus from an infectious process/sepsis 5/30 BP has improved.  We will start her on amlodipine since blood pressure more elevated now Avoid ACE inhibitor due to hyperkalemia  Hypernatremia Likely due to hypovolemia from DKA Improving Continue ivf, monitor closely      DVT prophylaxis: Lovenox Code Status: Full Family Communication: Family at bedside Disposition Plan: [place] Status is: Inpatient Remains inpatient appropriate because: IV treatment      LOS: 1 day   Time spent: 55 minutes    Lynn Ito, MD [facility] Pager [phone]  If 7PM-7AM, please contact night-coverage 02/27/2022, 4:05 PM

## 2022-02-27 NOTE — Progress Notes (Signed)
       CROSS COVER NOTE  NAME: Cheryl Rowland MRN: [id number] DOB : July 28, 1956   Notified by nursing they received ENDOTOOL alert that patient is to be transitioned off of insulin infusion.  Chart reviewed.  Gap is closed and beta-hydroxybutyrate level normalizing.    Will place on home basal insulin therapy.  This will include 5U of basal insulin daily and 4 units before each meal.    Diabetic diet additionally started.  Insulin infusion to be stopped 2 hrs after administration of basal insulin administration.     Initiating accuchecks QAC and QHS with sliding scale insulin.   Bishop Limbo DNP, MHA, FNP-BC Nurse Practitioner [facility] Pager [phone]

## 2022-02-28 DIAGNOSIS — N179 Acute kidney failure, unspecified: Secondary | ICD-10-CM | POA: Diagnosis not present

## 2022-02-28 DIAGNOSIS — I10 Essential (primary) hypertension: Secondary | ICD-10-CM | POA: Diagnosis not present

## 2022-02-28 DIAGNOSIS — E1110 Type 2 diabetes mellitus with ketoacidosis without coma: Secondary | ICD-10-CM | POA: Diagnosis not present

## 2022-02-28 DIAGNOSIS — E870 Hyperosmolality and hypernatremia: Secondary | ICD-10-CM | POA: Diagnosis not present

## 2022-02-28 LAB — RENAL FUNCTION PANEL
Albumin: 3.2 g/dL — ABNORMAL LOW (ref 3.5–5.0)
Anion gap: 11 (ref 5–15)
BUN: 36 mg/dL — ABNORMAL HIGH (ref 8–23)
CO2: 21 mmol/L — ABNORMAL LOW (ref 22–32)
Calcium: 9.2 mg/dL (ref 8.9–10.3)
Chloride: 119 mmol/L — ABNORMAL HIGH (ref 98–111)
Creatinine, Ser: 0.96 mg/dL (ref 0.44–1.00)
GFR, Estimated: >60 mL/min (ref >60)
Glucose, Bld: 405 mg/dL — ABNORMAL HIGH (ref 70–99)
Phosphorus: 3.2 mg/dL (ref 2.5–4.6)
Potassium: 4.2 mmol/L (ref 3.5–5.1)
Sodium: 151 mmol/L — ABNORMAL HIGH (ref 135–145)

## 2022-02-28 LAB — CBC
HCT: 41.3 % (ref 36.0–46.0)
Hemoglobin: 12.9 g/dL (ref 12.0–15.0)
MCH: 27.9 pg (ref 26.0–34.0)
MCHC: 31.2 g/dL (ref 30.0–36.0)
MCV: 89.2 fL (ref 80.0–100.0)
Platelets: 184 10*3/uL (ref 150–400)
RBC: 4.63 MIL/uL (ref 3.87–5.11)
RDW: 13.3 % (ref 11.5–15.5)
WBC: 7.2 10*3/uL (ref 4.0–10.5)
nRBC: 0 % (ref 0.0–0.2)

## 2022-02-28 LAB — PROCALCITONIN: Procalcitonin: <0.1 ng/mL

## 2022-02-28 LAB — GLUCOSE, CAPILLARY
Glucose-Capillary: 321 mg/dL — ABNORMAL HIGH (ref 70–99)
Glucose-Capillary: 304 mg/dL — ABNORMAL HIGH (ref 70–99)
Glucose-Capillary: 123 mg/dL — ABNORMAL HIGH (ref 70–99)
Glucose-Capillary: 115 mg/dL — ABNORMAL HIGH (ref 70–99)
Glucose-Capillary: 163 mg/dL — ABNORMAL HIGH (ref 70–99)

## 2022-02-28 LAB — LACTIC ACID, PLASMA
Lactic Acid, Venous: 1.6 mmol/L (ref 0.5–1.9)
Lactic Acid, Venous: 2 mmol/L (ref 0.5–1.9)

## 2022-02-28 LAB — MAGNESIUM: Magnesium: 2.2 mg/dL (ref 1.7–2.4)

## 2022-02-28 MED ORDER — LISINOPRIL 10 MG PO TABS
10.0000 mg | ORAL_TABLET | Freq: Every day | ORAL | Status: DC
  Administered 2022-02-28: 10 mg via ORAL
  Filled 2022-02-28: qty 1

## 2022-02-28 MED ORDER — LABETALOL HCL 5 MG/ML IV SOLN
10.0000 mg | INTRAVENOUS | Status: DC | PRN
  Administered 2022-02-28: 10 mg via INTRAVENOUS
  Filled 2022-02-28: qty 4

## 2022-02-28 MED ORDER — INSULIN GLARGINE-YFGN 100 UNIT/ML ~~LOC~~ SOLN
5.0000 [IU] | Freq: Two times a day (BID) | SUBCUTANEOUS | Status: DC
  Administered 2022-02-28 – 2022-03-01 (×2): 5 [IU] via SUBCUTANEOUS
  Filled 2022-02-28 (×3): qty 0.05

## 2022-02-28 MED ORDER — INSULIN ASPART 100 UNIT/ML IJ SOLN
6.0000 [IU] | Freq: Three times a day (TID) | INTRAMUSCULAR | Status: DC
  Filled 2022-02-28: qty 1

## 2022-02-28 MED ORDER — INSULIN GLARGINE-YFGN 100 UNIT/ML ~~LOC~~ SOLN
10.0000 [IU] | Freq: Two times a day (BID) | SUBCUTANEOUS | Status: DC
  Administered 2022-02-28: 5 [IU] via SUBCUTANEOUS
  Filled 2022-02-28 (×2): qty 0.1

## 2022-02-28 MED ORDER — INSULIN ASPART 100 UNIT/ML IJ SOLN: 5.0000 [IU] | Freq: Three times a day (TID) | INTRAMUSCULAR | Status: DC

## 2022-02-28 MED ORDER — INSULIN ASPART 100 UNIT/ML IJ SOLN
5.0000 [IU] | Freq: Three times a day (TID) | INTRAMUSCULAR | Status: DC
  Administered 2022-02-28 – 2022-03-01 (×2): 5 [IU] via SUBCUTANEOUS
  Filled 2022-02-28: qty 1

## 2022-02-28 MED ORDER — AMLODIPINE BESYLATE 10 MG PO TABS
10.0000 mg | ORAL_TABLET | Freq: Every day | ORAL | Status: DC
  Administered 2022-02-28 – 2022-03-01 (×2): 10 mg via ORAL
  Filled 2022-02-28 (×2): qty 1

## 2022-02-28 NOTE — TOC Initial Note (Signed)
Transition of Care [facility]) - Initial/Assessment Note    Patient Details  Name: Freddi Forster MRN: [id number] Date of Birth: 06-Jul-1956  Transition of Care [facility]) CM/SW Contact:    Truddie Hidden, RN Phone Number: 02/28/2022, 10:13 AM  Clinical Narrative:                  Transition of Care [facility]) Screening Note   Patient Details  Name: Selinda Korzeniewski Date of Birth: 01-17-1956   Transition of Care [facility]) CM/SW Contact:    Truddie Hidden, RN Phone Number: 02/28/2022, 10:13 AM    Transition of Care Department (TOC) has reviewed patient and no TOC needs have been identified at this time. We will continue to monitor patient advancement through interdisciplinary progression rounds. If new patient transition needs arise, please place a TOC consult.          Patient Goals and CMS Choice        Expected Discharge Plan and Services                                                Prior Living Arrangements/Services                       Activities of Daily Living Home Assistive Devices/Equipment: CBG Meter ADL Screening (condition at time of admission) Patient's cognitive ability adequate to safely complete daily activities?: Yes Is the patient deaf or have difficulty hearing?: No Does the patient have difficulty seeing, even when wearing glasses/contacts?: No Does the patient have difficulty concentrating, remembering, or making decisions?: No Patient able to express need for assistance with ADLs?: Yes Does the patient have difficulty dressing or bathing?: No Independently performs ADLs?: Yes (appropriate for developmental age) Does the patient have difficulty walking or climbing stairs?: No Weakness of Legs: None Weakness of Arms/Hands: None  Permission Sought/Granted                  Emotional Assessment              Admission diagnosis:  DKA (diabetic ketoacidosis) (HCC) [E11.10] Diabetic ketoacidosis without coma associated with  type 2 diabetes mellitus (HCC) [E11.10] Patient Active Problem List   Diagnosis Date Noted   Hypernatremia 02/27/2022   DKA (diabetic ketoacidosis) (HCC) 02/26/2022   AKI (acute kidney injury) (HCC) 02/26/2022   Hyperkalemia 02/26/2022   Hypotension 02/26/2022   PCP:  Pcp, No Pharmacy:   [id number] [place], [place] - [address] AT [facility] OF [address] [place] [address] Phone: [phone] Fax: [phone]  [facility] [place], [place] - [address] [place] [address] Phone: [phone] Fax: [phone]     Social Determinants of Health (SDOH) Interventions    Readmission Risk Interventions     View : No data to display.

## 2022-02-28 NOTE — Assessment & Plan Note (Signed)
Recent Labs  Lab 02/26/22 1111 02/26/22 1356 02/26/22 1901 02/26/22 2202 02/27/22 0156 02/27/22 0616 02/28/22 0945 03/01/22 0537 03/01/22 1242  NA 149* 153* 159* 159* 159* 157* 151* 150* 146*  Improved.  Lisinopril could help.  May consider evaluation for hyperaldosteronism if sodium remains elevated

## 2022-02-28 NOTE — Progress Notes (Signed)
Inpatient Diabetes Program Recommendations  AACE/ADA: New Consensus Statement on Inpatient Glycemic Control (2015)  Target Ranges:  Prepandial:   less than 140 mg/dL      Peak postprandial:   less than 180 mg/dL (1-2 hours)      Critically ill patients:  140 - 180 mg/dL   Lab Results  Component Value Date   GLUCAP 304 (H) 02/28/2022   HGBA1C 12.5 (H) 02/26/2022    Review of Glycemic Control  Home DM Meds: Lantus 5 units QHS Current orders for Inpatient glycemic control: Semglee 5 units bd, Novolog 4 units tid meal coverage, Novolog correction 0-15 units tid, 0-5 units hs  Inpatient Diabetes Program Recommendations:   Postprandial CBGs continue to be elevated. Please consider: -Increase Novolog meal coverage to 6 units tid if eats 50%  Thank you, Bethena Roys E. Zya Finkle, RN, MSN, CDE  Diabetes Coordinator Inpatient Glycemic Control Team Team Pager [phone] (8am-5pm) 02/28/2022 12:35 PM

## 2022-02-28 NOTE — Hospital Course (Signed)
66 year old F with PMH of IDDM-2, HTN and HLD presented to urgent care with progressive weakness, dysuria and frequency for about 3 days and found to be hypotensive with BP in 60s/30s and hyperglycemia to 600.  Received IV fluid bolus and directed to ED. she was admitted for diabetic ketoacidosis, hypotension, AKI, hyperkalemia and hyponatremia.  Started on IV fluid and insulin drips.   The next day, DKA resolved.  She was transitioned to subcu insulin.  Hypernatremia improving.  Hyperkalemia and AKI resolved.

## 2022-02-28 NOTE — Progress Notes (Signed)
PROGRESS NOTE  Cheryl Rowland [id number] DOB: August 05, 1956   PCP: Pcp, No  Patient is from: Home.  Lives alone.  Independently ambulates at baseline.  DOA: 02/26/2022 LOS: 2  Chief complaints Chief Complaint  Patient presents with   Hyperglycemia    Blood sugar <600, altered , hypotensive per ems, 60's/30's      Brief Narrative / Interim history: 66 year old F with PMH of IDDM-2, HTN and HLD presented to urgent care with progressive weakness, dysuria and frequency for about 3 days and found to be hypotensive with BP in 60s/30s and hyperglycemia to 600.  Received IV fluid bolus and directed to ED. she was admitted for diabetic ketoacidosis, hypotension, AKI, hyperkalemia and hyponatremia.  Started on IV fluid and insulin drips.   The next day, DKA resolved.  She was transitioned to subcu insulin.  Hypernatremia improving.  Hyperkalemia and AKI resolved.   Subjective: Seen and examined earlier this morning.  No major events overnight of this morning.  No complaints.  She denies chest pain, dyspnea, GI or UTI symptoms.  Objective: Vitals:   02/28/22 0815 02/28/22 0956 02/28/22 1147 02/28/22 1629  BP: (!) 181/97 (!) 186/90 (!) 107/103 (!) 182/93  Pulse: 88 84 79 84  Resp: 18  18 16   Temp: 97.8 F (36.6 C)  97.7 F (36.5 C) 98.3 F (36.8 C)  TempSrc: Oral  Oral Oral  SpO2: 100%  99% 99%  Weight:      Height:        Examination:  GENERAL: No apparent distress.  Nontoxic. HEENT: MMM.  Vision and hearing grossly intact.  NECK: Supple.  No apparent JVD.  RESP:  No IWOB.  Fair aeration bilaterally. CVS:  RRR. Heart sounds normal.  ABD/GI/GU: BS+. Abd soft, NTND.  MSK/EXT:  Moves extremities. No apparent deformity. No edema.  SKIN: no apparent skin lesion or wound NEURO: Awake, alert and oriented appropriately.  No apparent focal neuro deficit. PSYCH: Calm. Normal affect.   Procedures:  None  Microbiology summarized: MRSA PCR screen nonreactive. Blood cultures  NGTD  Assessment and Plan: * Uncontrolled IDDM-2 with DKA A1c 12.5%.  No prior value.  She only uses 5 units of Lantus at night at home.  DKA resolved. Recent Labs  Lab 02/27/22 2026 02/28/22 0813 02/28/22 1145 02/28/22 1627 02/28/22 1634  GLUCAP 199* 321* 304* 123* 115*  -Continue SSI-moderate -Received Semglee 5 units earlier.  Give additional 10 units this afternoon. -Decrease Semglee to 5 units twice daily starting tonight -NovoLog 5 units 3 times daily with meals -Continue Lipitor.   Uncontrolled hypertension Blood pressure elevated. -Start amlodipine 10 mg daily -Resume home lisinopril at reduced dose -IV labetalol as needed -Discontinue IV fluid  Hypernatremia Recent Labs  Lab 02/26/22 1111 02/26/22 1356 02/26/22 1901 02/26/22 2202 02/27/22 0156 02/27/22 0616 02/28/22 0945  NA 149* 153* 159* 159* 159* 157* 151*  Improving.  Continue monitoring   AKI (acute kidney injury) ([place]) Recent Labs    02/26/22 1111 02/26/22 1356 02/26/22 1901 02/26/22 2202 02/27/22 0156 02/27/22 0616 02/28/22 0945  BUN 62* 59* 58* 54* 53* 53* 36*  CREATININE 2.42* 2.10* 1.76* 1.52* 1.29* 1.31* 0.96  Likely hemodynamically mediated from hypotension and DKA.  AKI seems to have resolved. -Monitor off IV fluid   Hyperkalemia Likely due to acidosis.  Resolved.  Hypotension Likely due to DKA and lisinopril.  Resolved.  Now hypertensive.      DVT prophylaxis:  enoxaparin (LOVENOX) injection 40 mg Start: 02/27/22 2200  Code Status: Full  code Family Communication: Patient and/or RN. Available if any question.  Level of care: Progressive.  Change level of care to telemetry Status is: Inpatient Remains inpatient appropriate because: Uncontrolled diabetes with DKA, uncontrolled hypertension and hypernatremia   Final disposition: Likely home in the next 24 to 48 hours. Consultants:  None  Sch Meds:  Scheduled Meds:  amLODipine  10 mg Oral Daily   Chlorhexidine  Gluconate Cloth  6 each Topical Q0600   enoxaparin (LOVENOX) injection  40 mg Subcutaneous Q24H   insulin aspart  0-15 Units Subcutaneous TID WC   insulin aspart  0-5 Units Subcutaneous QHS   insulin aspart  5 Units Subcutaneous TID WC   insulin glargine-yfgn  5 Units Subcutaneous BID   lisinopril  10 mg Oral Daily   rosuvastatin  10 mg Oral Daily   Continuous Infusions: PRN Meds:.dextrose, labetalol  Antimicrobials: Anti-infectives (From admission, onward)    Start     Dose/Rate Route Frequency Ordered Stop   02/26/22 1300  vancomycin (VANCOREADY) IVPB 1500 mg/300 mL        1,500 mg 150 mL/hr over 120 Minutes Intravenous  Once 02/26/22 1248 02/26/22 1531   02/26/22 1245  ceFEPIme (MAXIPIME) 2 g in sodium chloride 0.9 % 100 mL IVPB        2 g 200 mL/hr over 30 Minutes Intravenous  Once 02/26/22 1232 02/26/22 1309   02/26/22 1245  vancomycin (VANCOCIN) IVPB 1000 mg/200 mL premix  Status:  Discontinued        1,000 mg 200 mL/hr over 60 Minutes Intravenous  Once 02/26/22 1232 02/26/22 1248        I have personally reviewed the following labs and images: CBC: Recent Labs  Lab 02/26/22 1111 02/28/22 0945  WBC 8.0 7.2  NEUTROABS 6.7  --   HGB 13.6 12.9  HCT 46.4* 41.3  MCV 95.1 89.2  PLT 199 184   BMP &GFR Recent Labs  Lab 02/26/22 1901 02/26/22 2202 02/27/22 0156 02/27/22 0616 02/28/22 0945  NA 159* 159* 159* 157* 151*  K 4.6 4.9 4.7 4.7 4.2  CL 126* 128* 128* 126* 119*  CO2 19* 19* 22 21* 21*  GLUCOSE 333* 209* 250* 239* 405*  BUN 58* 54* 53* 53* 36*  CREATININE 1.76* 1.52* 1.29* 1.31* 0.96  CALCIUM 9.8 9.8 9.4 9.3 9.2  MG  --   --   --   --  2.2  PHOS  --   --   --   --  3.2   Estimated Creatinine Clearance: 52.9 mL/min (by C-G formula based on SCr of 0.96 mg/dL). Liver & Pancreas: Recent Labs  Lab 02/26/22 1111 02/28/22 0945  AST 13*  --   ALT 17  --   ALKPHOS 134*  --   BILITOT 1.3*  --   PROT 7.5  --   ALBUMIN 3.9 3.2*   No results for  input(s): LIPASE, AMYLASE in the last 168 hours. No results for input(s): AMMONIA in the last 168 hours. Diabetic: Recent Labs    02/26/22 1356  HGBA1C 12.5*   Recent Labs  Lab 02/27/22 2026 02/28/22 0813 02/28/22 1145 02/28/22 1627 02/28/22 1634  GLUCAP 199* 321* 304* 123* 115*   Cardiac Enzymes: No results for input(s): CKTOTAL, CKMB, CKMBINDEX, TROPONINI in the last 168 hours. No results for input(s): PROBNP in the last 8760 hours. Coagulation Profile: No results for input(s): INR, PROTIME in the last 168 hours. Thyroid Function Tests: No results for input(s): TSH, T4TOTAL, FREET4, T3FREE, THYROIDAB  in the last 72 hours. Lipid Profile: No results for input(s): CHOL, HDL, LDLCALC, TRIG, CHOLHDL, LDLDIRECT in the last 72 hours. Anemia Panel: No results for input(s): VITAMINB12, FOLATE, FERRITIN, TIBC, IRON, RETICCTPCT in the last 72 hours. Urine analysis:    Component Value Date/Time   COLORURINE STRAW (A) 02/26/2022 1356   APPEARANCEUR HAZY (A) 02/26/2022 1356   LABSPEC 1.020 02/26/2022 1356   PHURINE 5.0 02/26/2022 1356   GLUCOSEU >=500 (A) 02/26/2022 1356   HGBUR SMALL (A) 02/26/2022 1356   BILIRUBINUR NEGATIVE 02/26/2022 1356   KETONESUR 80 (A) 02/26/2022 1356   PROTEINUR 30 (A) 02/26/2022 1356   NITRITE NEGATIVE 02/26/2022 1356   LEUKOCYTESUR NEGATIVE 02/26/2022 1356   Sepsis Labs: Invalid input(s): PROCALCITONIN, [place]  Microbiology: Recent Results (from the past 240 hour(s))  Culture, blood (routine x 2)     Status: None (Preliminary result)   Collection Time: 02/26/22 12:42 PM   Specimen: BLOOD  Result Value Ref Range Status   Specimen Description BLOOD RIGHT [facility]  Final   Special Requests   Final    BOTTLES DRAWN AEROBIC AND ANAEROBIC Blood Culture adequate volume   Culture   Final    NO GROWTH 2 DAYS Performed at [facility], [address], [place], [place] [address]    Report Status PENDING  Incomplete  Culture, blood (routine x  2)     Status: None (Preliminary result)   Collection Time: 02/26/22 12:42 PM   Specimen: BLOOD  Result Value Ref Range Status   Specimen Description BLOOD LEFT [facility]  Final   Special Requests   Final    BOTTLES DRAWN AEROBIC AND ANAEROBIC Blood Culture adequate volume   Culture   Final    NO GROWTH 2 DAYS Performed at [facility], [address] [place], [place] [address]    Report Status PENDING  Incomplete  MRSA Next Gen by PCR, Nasal     Status: None   Collection Time: 02/26/22  4:35 PM   Specimen: Nasal Mucosa; Nasal Swab  Result Value Ref Range Status   MRSA by PCR Next Gen NOT DETECTED NOT DETECTED Final    Comment: (NOTE) The GeneXpert MRSA Assay (FDA approved for NASAL specimens only), is one component of a comprehensive MRSA colonization surveillance program. It is not intended to diagnose MRSA infection nor to guide or monitor treatment for MRSA infections. Test performance is not FDA approved in patients less than 17 years old. Performed at [facility], [address] [place], [place] [address]     Radiology Studies: No results found.    Tajh Livsey T. [place]  If 7PM-7AM, please contact night-coverage [email or web address] 02/28/2022, 5:33 PM

## 2022-02-28 NOTE — Plan of Care (Signed)
  Problem: Skin Integrity: Goal: Risk for impaired skin integrity will decrease 02/28/2022 0535 by Juliannah Ohmann, RN Outcome: Progressing 02/28/2022 0535 by Seng Larch, RN Outcome: Progressing   Problem: Safety: Goal: Ability to remain free from injury will improve 02/28/2022 0535 by Hermena Swint, RN Outcome: Progressing 02/28/2022 0535 by Sherrel Shafer, RN Outcome: Progressing   Problem: Education: Goal: Ability to describe self-care measures that may prevent or decrease complications (Diabetes Survival Skills Education) will improve 02/28/2022 0535 by Hermann Dottavio, RN Outcome: Progressing 02/28/2022 0535 by Delance Weide, RN Outcome: Progressing   Problem: Metabolic: Goal: Ability to maintain appropriate glucose levels will improve 02/28/2022 0535 by Janis Sol, RN Outcome: Progressing 02/28/2022 0535 by Keiona Jenison, RN Outcome: Progressing

## 2022-02-28 NOTE — Assessment & Plan Note (Signed)
Blood pressure improved. -Discharged on home lisinopril 40 mg daily

## 2022-02-28 NOTE — Plan of Care (Signed)

## 2022-02-28 NOTE — Progress Notes (Signed)
65 yof FC with loss of consciousness and Diabetic Ketoacidosis. Blood sugar check is AC/HS. Currently alert and oriented. Gets Semglee and Novolog Insulin for coverage. 20 G in Right Forearm with LR at 125 ml/hr continuous.

## 2022-03-01 DIAGNOSIS — N179 Acute kidney failure, unspecified: Secondary | ICD-10-CM | POA: Diagnosis not present

## 2022-03-01 DIAGNOSIS — E1110 Type 2 diabetes mellitus with ketoacidosis without coma: Secondary | ICD-10-CM | POA: Diagnosis not present

## 2022-03-01 DIAGNOSIS — E870 Hyperosmolality and hypernatremia: Secondary | ICD-10-CM | POA: Diagnosis not present

## 2022-03-01 DIAGNOSIS — I10 Essential (primary) hypertension: Secondary | ICD-10-CM | POA: Diagnosis not present

## 2022-03-01 LAB — CBC
HCT: 41.3 % (ref 36.0–46.0)
Hemoglobin: 13 g/dL (ref 12.0–15.0)
MCH: 27.8 pg (ref 26.0–34.0)
MCHC: 31.5 g/dL (ref 30.0–36.0)
MCV: 88.4 fL (ref 80.0–100.0)
Platelets: 181 10*3/uL (ref 150–400)
RBC: 4.67 MIL/uL (ref 3.87–5.11)
RDW: 13 % (ref 11.5–15.5)
WBC: 4.9 10*3/uL (ref 4.0–10.5)
nRBC: 0 % (ref 0.0–0.2)

## 2022-03-01 LAB — BASIC METABOLIC PANEL
Anion gap: 6 (ref 5–15)
BUN: 25 mg/dL — ABNORMAL HIGH (ref 8–23)
CO2: 27 mmol/L (ref 22–32)
Calcium: 9.2 mg/dL (ref 8.9–10.3)
Chloride: 113 mmol/L — ABNORMAL HIGH (ref 98–111)
Creatinine, Ser: 0.83 mg/dL (ref 0.44–1.00)
GFR, Estimated: >60 mL/min (ref >60)
Glucose, Bld: 260 mg/dL — ABNORMAL HIGH (ref 70–99)
Potassium: 3.7 mmol/L (ref 3.5–5.1)
Sodium: 146 mmol/L — ABNORMAL HIGH (ref 135–145)

## 2022-03-01 LAB — RENAL FUNCTION PANEL
Albumin: 3.2 g/dL — ABNORMAL LOW (ref 3.5–5.0)
Anion gap: 6 (ref 5–15)
BUN: 22 mg/dL (ref 8–23)
CO2: 28 mmol/L (ref 22–32)
Calcium: 9 mg/dL (ref 8.9–10.3)
Chloride: 116 mmol/L — ABNORMAL HIGH (ref 98–111)
Creatinine, Ser: 0.7 mg/dL (ref 0.44–1.00)
GFR, Estimated: >60 mL/min (ref >60)
Glucose, Bld: 218 mg/dL — ABNORMAL HIGH (ref 70–99)
Phosphorus: 2.7 mg/dL (ref 2.5–4.6)
Potassium: 3.7 mmol/L (ref 3.5–5.1)
Sodium: 150 mmol/L — ABNORMAL HIGH (ref 135–145)

## 2022-03-01 LAB — CK: Total CK: 270 U/L — ABNORMAL HIGH (ref 38–234)

## 2022-03-01 LAB — MAGNESIUM: Magnesium: 2.2 mg/dL (ref 1.7–2.4)

## 2022-03-01 LAB — GLUCOSE, CAPILLARY
Glucose-Capillary: 217 mg/dL — ABNORMAL HIGH (ref 70–99)
Glucose-Capillary: 281 mg/dL — ABNORMAL HIGH (ref 70–99)

## 2022-03-01 MED ORDER — INSULIN GLARGINE 100 UNIT/ML ~~LOC~~ SOLN: 15.0000 [IU] | Freq: Two times a day (BID) | SUBCUTANEOUS | 11 refills | Status: AC

## 2022-03-01 MED ORDER — LISINOPRIL 20 MG PO TABS
20.0000 mg | ORAL_TABLET | Freq: Every day | ORAL | Status: DC
  Administered 2022-03-01: 20 mg via ORAL
  Filled 2022-03-01: qty 1

## 2022-03-01 MED ORDER — BLOOD GLUCOSE METER KIT: PACK | 0 refills | Status: AC

## 2022-03-01 MED ORDER — INSULIN GLARGINE 100 UNIT/ML ~~LOC~~ SOLN: 15.0000 [IU] | Freq: Two times a day (BID) | SUBCUTANEOUS | 11 refills | Status: DC

## 2022-03-01 MED ORDER — INSULIN ASPART 100 UNIT/ML IJ SOLN
6.0000 [IU] | Freq: Three times a day (TID) | INTRAMUSCULAR | Status: DC
  Administered 2022-03-01: 6 [IU] via SUBCUTANEOUS
  Filled 2022-03-01: qty 1

## 2022-03-01 MED ORDER — BLOOD GLUCOSE METER KIT: PACK | 0 refills | Status: DC

## 2022-03-01 NOTE — Discharge Summary (Signed)
Physician Discharge Summary  Cheryl Rowland BVQ:[id number] DOB: 01-Apr-1956 DOA: 02/26/2022  PCP: Pcp, No  Admit date: 02/26/2022 Discharge date: 03/01/2022 Admitted From: Home. Disposition: Home Recommendations for Outpatient Follow-up:  Follow ups as below. Please obtain CBC/BMP/Mag at follow up Please follow up on the following pending results: None  Home Health: Not indicated Equipment/Devices: Not indicated  Discharge Condition: Stable CODE STATUS: Full code   Hospital course 66 year old F with PMH of IDDM-2, HTN and HLD presented to urgent care with progressive weakness, dysuria and frequency for about 3 days and found to be hypotensive with BP in 60s/30s and hyperglycemia to 600.  Received IV fluid bolus and directed to ED. she was admitted for diabetic ketoacidosis, hypotension, AKI, hyperkalemia and hyponatremia.  Started on IV fluid and insulin drips.   The next day, DKA and hypotension resolved.  She was transitioned to subcu insulin.  Hyperkalemia and AKI resolved.  Hypernatremia improved after resuming lisinopril.  Lantus increased from 5 units daily to 15 units twice daily based on her insulin requirement over the last 24 hours.  Recommended outpatient follow-up with PCP in 1 to 2 weeks.   See individual problem list below for more on hospital course.  Problems addressed during this hospitalization Problem  Uncontrolled IDDM-2 with DKA  Uncontrolled Hypertension  Hypernatremia  Aki (Acute Kidney Injury) ([place])  Hyperkalemia  Hypotension    Assessment and Plan: * Uncontrolled IDDM-2 with DKA A1c 12.5%.  No prior value.  She only uses 5 units of Lantus at night at home.  DKA resolved. Recent Labs  Lab 02/28/22 1627 02/28/22 1634 02/28/22 2214 03/01/22 0811 03/01/22 1155  GLUCAP 123* 115* 163* 217* 281*  -Increased home Lantus from 5 units daily to 15 units twice daily -Consider p.o. medication such as metformin, GLP agonist or SGLT-2 inhibitors. -Further  adjustment as appropriate. -Continue Lipitor.   Uncontrolled hypertension Blood pressure improved. -Discharged on home lisinopril 40 mg daily  Hypernatremia Recent Labs  Lab 02/26/22 1111 02/26/22 1356 02/26/22 1901 02/26/22 2202 02/27/22 0156 02/27/22 0616 02/28/22 0945 03/01/22 0537 03/01/22 1242  NA 149* 153* 159* 159* 159* 157* 151* 150* 146*  Improved.  Lisinopril could help.  May consider evaluation for hyperaldosteronism if sodium remains elevated   AKI (acute kidney injury) ([place]) Recent Labs    02/26/22 1111 02/26/22 1356 02/26/22 1901 02/26/22 2202 02/27/22 0156 02/27/22 0616 02/28/22 0945 03/01/22 0537 03/01/22 1242  BUN 62* 59* 58* 54* 53* 53* 36* 22 25*  CREATININE 2.42* 2.10* 1.76* 1.52* 1.29* 1.31* 0.96 0.70 0.83  Likely hemodynamically mediated from hypotension and DKA.  AKI seems to have resolved.   Hyperkalemia Likely due to acidosis.  Resolved.  Hypotension Likely due to DKA and lisinopril.  Resolved.      Vital signs Vitals:   03/01/22 0038 03/01/22 0447 03/01/22 0815 03/01/22 1152  BP: (!) 168/91 (!) 157/96 (!) 148/90 (!) 137/91  Pulse: 83 81 77 77  Temp: 97.9 F (36.6 C) 97.7 F (36.5 C)  97.9 F (36.6 C)  Resp: _0 Height:      Weight:      SpO2: 98% 94% 100% 97%  TempSrc:      BMI (Calculated):         Discharge exam  GENERAL: No apparent distress.  Nontoxic. HEENT: MMM.  Vision and hearing grossly intact.  NECK: Supple.  No apparent JVD.  RESP:  No IWOB.  Fair aeration bilaterally. CVS:  RRR. Heart sounds normal.  ABD/GI/GU:  BS+. Abd soft, NTND.  MSK/EXT:  Moves extremities. No apparent deformity. No edema.  SKIN: no apparent skin lesion or wound NEURO: Awake and alert. Oriented appropriately.  No apparent focal neuro deficit. PSYCH: Calm. Normal affect.   Discharge Instructions Discharge Instructions     Call MD for:  difficulty breathing, headache or visual disturbances   Complete by: As directed     Call MD for:  persistant dizziness or light-headedness   Complete by: As directed    Call MD for:  persistant nausea and vomiting   Complete by: As directed    Call MD for:  severe uncontrolled pain   Complete by: As directed    Diet - low sodium heart healthy   Complete by: As directed    Diet Carb Modified   Complete by: As directed    Discharge instructions   Complete by: As directed    It has been a pleasure taking care of you!  You were hospitalized due to uncontrolled diabetes with diabetic ketoacidosis for which you have been treated with insulin drips and IV fluid.  This is likely due to inadequate insulin administration/dosage.  We have increased her Lantus to 15 units twice daily.  We also recommend checking her blood glucose 2-3 times a day before meals and keeping blood glucose log that you can take to your primary care doctor to review.  Follow-up with your primary care doctor in 1 to 2 weeks or sooner if needed.  Please review your new medication list and the directions on your medications before you take them.  Keep yourself hydrated   Take care,   Increase activity slowly   Complete by: As directed       Allergies as of 03/01/2022       Reactions   Penicillins         Medication List     TAKE these medications    blood glucose meter kit and supplies Dispense based on patient and insurance preference. Use up to four times daily as directed. (FOR ICD-10 E10.9, E11.9).   Cholecalciferol 50 MCG (2000 UT) Tabs TAKE ONE TABLET BY MOUTH ONCE DAILY FOR VITAMIN D DEFICIENCY   insulin glargine 100 UNIT/ML injection Commonly known as: LANTUS Inject 0.15 mLs (15 Units total) into the skin 2 (two) times daily. What changed: See the new instructions.   lisinopril 40 MG tablet Commonly known as: ZESTRIL TAKE ONE TABLET BY MOUTH ONCE DAILY FOR HIGH BLOOD PRESSURE ALSO KIDNEY PROTECTION. INCREASED FROM 20MG TO 40MG   rosuvastatin 10 MG tablet Commonly known as:  CRESTOR TAKE ONE TABLET BY MOUTH DAILY FOR PRIMARY PREVENTION OF HEART ATTACK TO LOWER CHOLESTEROL        Consultations: None  Procedures/Studies:   DG Chest Port 1 View  Result Date: 02/26/2022 CLINICAL DATA:  Hyperglycemia.  Sepsis.  Altered mental status. EXAM: PORTABLE CHEST 1 VIEW COMPARISON:  None Available. FINDINGS: Cardiac silhouette is normal in size and configuration. Normal mediastinal and hilar contours. Clear lungs.  No pleural effusion or pneumothorax. Skeletal structures are grossly intact. IMPRESSION: No active disease. Electronically Signed   By: Lajean Manes M.D.   On: 02/26/2022 12:54       The results of significant diagnostics from this hospitalization (including imaging, microbiology, ancillary and laboratory) are listed below for reference.     Microbiology: Recent Results (from the past 240 hour(s))  Culture, blood (routine x 2)     Status: None (Preliminary result)   Collection Time:  02/26/22 12:42 PM   Specimen: BLOOD  Result Value Ref Range Status   Specimen Description BLOOD RIGHT [facility]  Final   Special Requests   Final    BOTTLES DRAWN AEROBIC AND ANAEROBIC Blood Culture adequate volume   Culture   Final    NO GROWTH 3 DAYS Performed at [facility], [address] [place], [place] [address]    Report Status PENDING  Incomplete  Culture, blood (routine x 2)     Status: None (Preliminary result)   Collection Time: 02/26/22 12:42 PM   Specimen: BLOOD  Result Value Ref Range Status   Specimen Description BLOOD LEFT [facility]  Final   Special Requests   Final    BOTTLES DRAWN AEROBIC AND ANAEROBIC Blood Culture adequate volume   Culture   Final    NO GROWTH 3 DAYS Performed at [facility], [address] [place], [place] [address]    Report Status PENDING  Incomplete  MRSA Next Gen by PCR, Nasal     Status: None   Collection Time: 02/26/22  4:35 PM   Specimen: Nasal Mucosa; Nasal Swab  Result Value Ref Range Status   MRSA by  PCR Next Gen NOT DETECTED NOT DETECTED Final    Comment: (NOTE) The GeneXpert MRSA Assay (FDA approved for NASAL specimens only), is one component of a comprehensive MRSA colonization surveillance program. It is not intended to diagnose MRSA infection nor to guide or monitor treatment for MRSA infections. Test performance is not FDA approved in patients less than 73 years old. Performed at [facility], [address] [place], [place] [address]      Labs:  CBC: Recent Labs  Lab 02/26/22 1111 02/28/22 0945 03/01/22 0537  WBC 8.0 7.2 4.9  NEUTROABS 6.7  --   --   HGB 13.6 12.9 13.0  HCT 46.4* 41.3 41.3  MCV 95.1 89.2 88.4  PLT 199 184 181   BMP &GFR Recent Labs  Lab 02/27/22 0156 02/27/22 0616 02/28/22 0945 03/01/22 0537 03/01/22 1242  NA 159* 157* 151* 150* 146*  K 4.7 4.7 4.2 3.7 3.7  CL 128* 126* 119* 116* 113*  CO2 22 21* 21* 28 27  GLUCOSE 250* 239* 405* 218* 260*  BUN 53* 53* 36* 22 25*  CREATININE 1.29* 1.31* 0.96 0.70 0.83  CALCIUM 9.4 9.3 9.2 9.0 9.2  MG  --   --  2.2 2.2  --   PHOS  --   --  3.2 2.7  --    Estimated Creatinine Clearance: 61.2 mL/min (by C-G formula based on SCr of 0.83 mg/dL). Liver & Pancreas: Recent Labs  Lab 02/26/22 1111 02/28/22 0945 03/01/22 0537  AST 13*  --   --   ALT 17  --   --   ALKPHOS 134*  --   --   BILITOT 1.3*  --   --   PROT 7.5  --   --   ALBUMIN 3.9 3.2* 3.2*   No results for input(s): LIPASE, AMYLASE in the last 168 hours. No results for input(s): AMMONIA in the last 168 hours. Diabetic: No results for input(s): HGBA1C in the last 72 hours. Recent Labs  Lab 02/28/22 1627 02/28/22 1634 02/28/22 2214 03/01/22 0811 03/01/22 1155  GLUCAP 123* 115* 163* 217* 281*   Cardiac Enzymes: Recent Labs  Lab 03/01/22 0537  CKTOTAL 270*   No results for input(s): PROBNP in the last 8760 hours. Coagulation Profile: No results for input(s): INR, PROTIME in  the last 168 hours. Thyroid Function  Tests: No results for input(s): TSH, T4TOTAL, FREET4, T3FREE, THYROIDAB in the last 72 hours. Lipid Profile: No results for input(s): CHOL, HDL, LDLCALC, TRIG, CHOLHDL, LDLDIRECT in the last 72 hours. Anemia Panel: No results for input(s): VITAMINB12, FOLATE, FERRITIN, TIBC, IRON, RETICCTPCT in the last 72 hours. Urine analysis:    Component Value Date/Time   COLORURINE STRAW (A) 02/26/2022 1356   APPEARANCEUR HAZY (A) 02/26/2022 1356   LABSPEC 1.020 02/26/2022 1356   PHURINE 5.0 02/26/2022 1356   GLUCOSEU >=500 (A) 02/26/2022 1356   HGBUR SMALL (A) 02/26/2022 1356   BILIRUBINUR NEGATIVE 02/26/2022 1356   KETONESUR 80 (A) 02/26/2022 1356   PROTEINUR 30 (A) 02/26/2022 1356   NITRITE NEGATIVE 02/26/2022 1356   LEUKOCYTESUR NEGATIVE 02/26/2022 1356   Sepsis Labs: Invalid input(s): PROCALCITONIN, LACTICIDVEN   Time coordinating discharge: 45 minutes  SIGNED:  Mercy Riding, MD  [facility] 03/01/2022, 6:28 PM

## 2022-03-01 NOTE — Progress Notes (Signed)
Mobility Specialist - Progress Note    03/01/22 1400  Mobility  Activity Ambulated independently in hallway;Stood at bedside;Dangled on edge of bed  Level of Assistance Independent  Assistive Device None  Distance Ambulated (ft) 480 ft  Activity Response Tolerated well  $Mobility charge 1 Mobility    Pt semi supine upon arrival using RA. Completes all activities indep and ambulates 3 laps around NS voicing no complaints. Pt is left in room with needs in reach.  Clarisa Schools Mobility Specialist 03/01/22, 2:04 PM

## 2022-03-03 LAB — CULTURE, BLOOD (ROUTINE X 2)
Culture: NO GROWTH
Culture: NO GROWTH
Special Requests: ADEQUATE
Special Requests: ADEQUATE

## 2022-06-16 IMAGING — DX DG CHEST 1V PORT
1 series · 1 of 1 positions shown · non-contrast
Comparison: None Available.

CLINICAL DATA: Hyperglycemia.  Sepsis.  Altered mental status.

EXAM:
PORTABLE CHEST 1 VIEW

[chest ap]
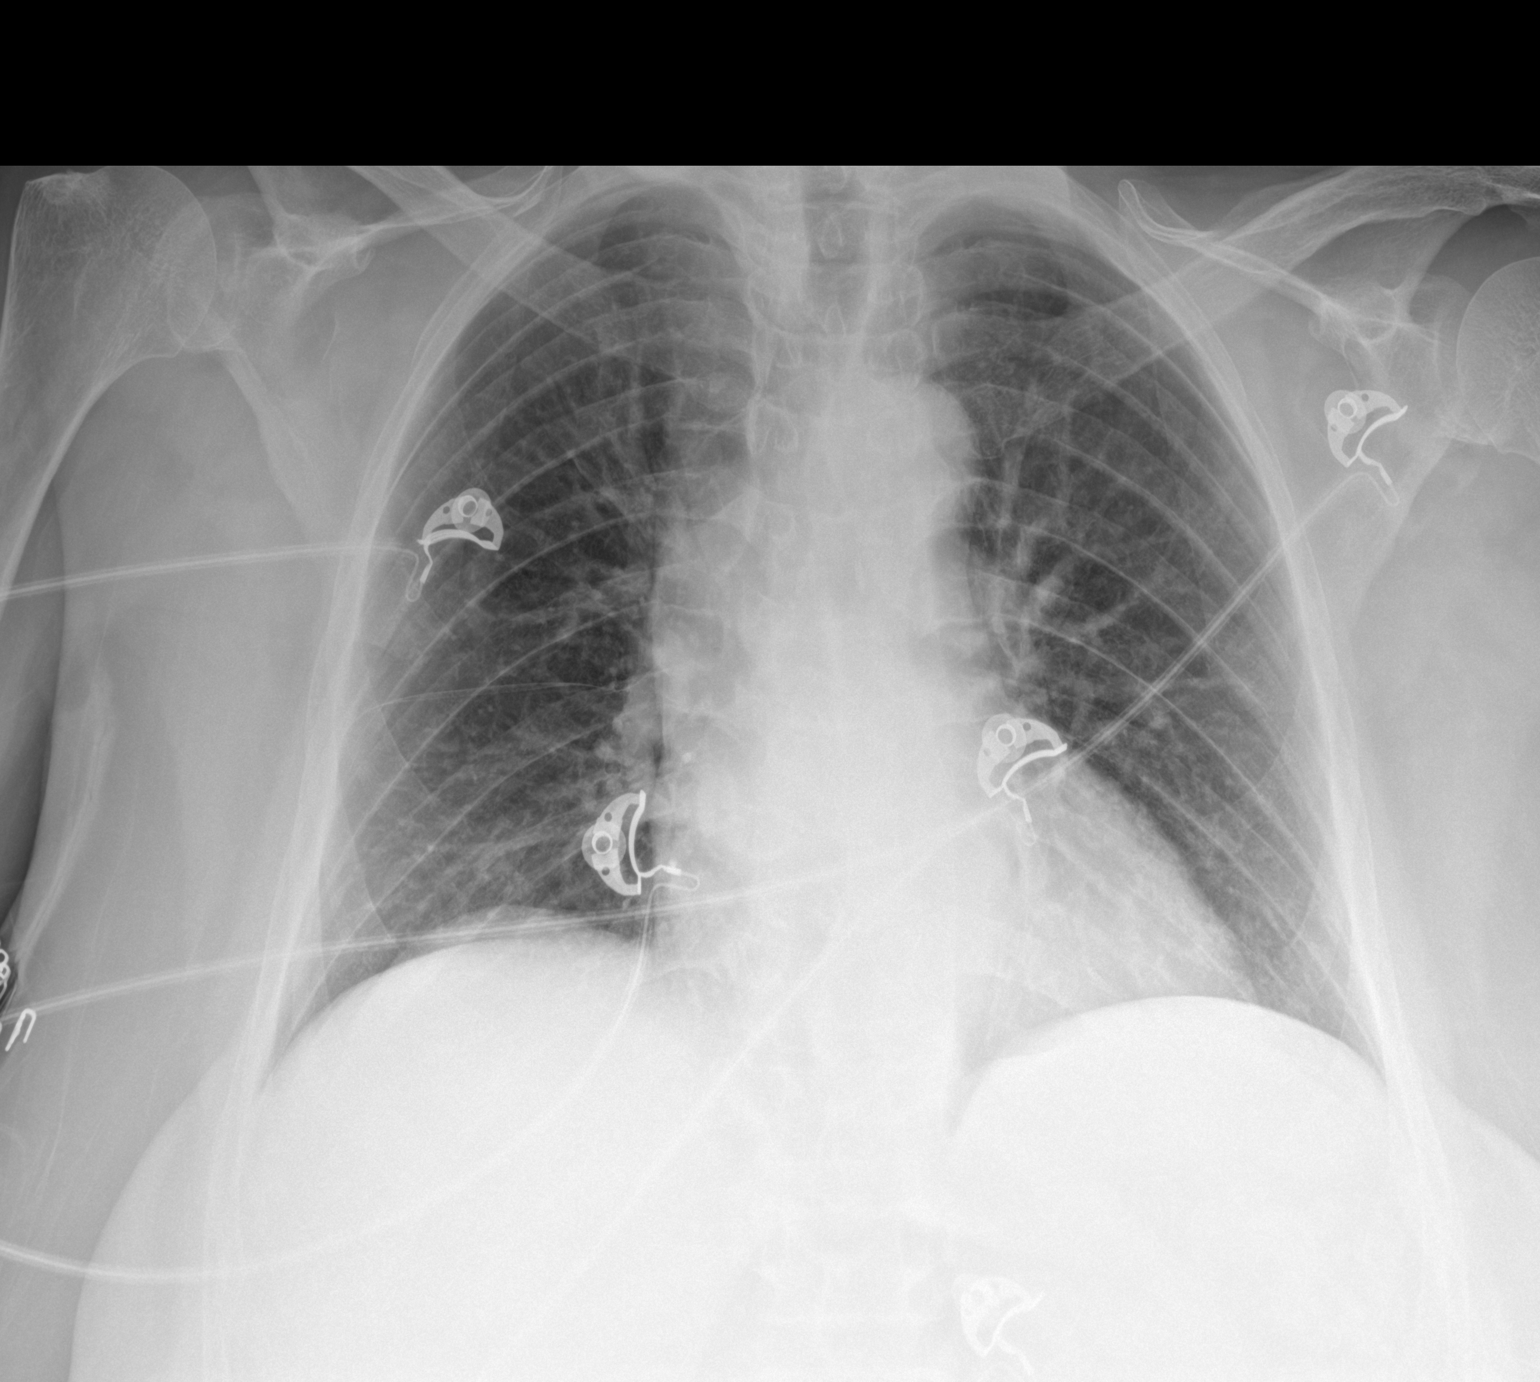

[1 of 1 positions shown; findings below may reference images not displayed]

FINDINGS: Cardiac silhouette is normal in size and configuration. Normal
mediastinal and hilar contours.

Clear lungs.  No pleural effusion or pneumothorax.

Skeletal structures are grossly intact.
IMPRESSION: No active disease.
